# Patient Record
Sex: Female | Born: 1980 | Race: Black or African American | Hispanic: No | Marital: Single | Smoking: Never smoker
Health system: Southern US, Community
[De-identification: ages and names within clinical notes are randomized; demographics above are authoritative.]

## PROBLEM LIST (undated history)

## (undated) DIAGNOSIS — R519 Headache, unspecified: Secondary | ICD-10-CM

## (undated) DIAGNOSIS — I10 Essential (primary) hypertension: Secondary | ICD-10-CM

## (undated) DIAGNOSIS — D259 Leiomyoma of uterus, unspecified: Secondary | ICD-10-CM

## (undated) DIAGNOSIS — F419 Anxiety disorder, unspecified: Secondary | ICD-10-CM

## (undated) DIAGNOSIS — Z302 Encounter for sterilization: Secondary | ICD-10-CM

## (undated) DIAGNOSIS — F32A Depression, unspecified: Secondary | ICD-10-CM

## (undated) DIAGNOSIS — E559 Vitamin D deficiency, unspecified: Secondary | ICD-10-CM

## (undated) DIAGNOSIS — O09299 Supervision of pregnancy with other poor reproductive or obstetric history, unspecified trimester: Secondary | ICD-10-CM

## (undated) DIAGNOSIS — R51 Headache: Secondary | ICD-10-CM

## (undated) DIAGNOSIS — Z3490 Encounter for supervision of normal pregnancy, unspecified, unspecified trimester: Secondary | ICD-10-CM

## (undated) DIAGNOSIS — D649 Anemia, unspecified: Secondary | ICD-10-CM

## (undated) DIAGNOSIS — F329 Major depressive disorder, single episode, unspecified: Secondary | ICD-10-CM

## (undated) DIAGNOSIS — D219 Benign neoplasm of connective and other soft tissue, unspecified: Secondary | ICD-10-CM

## (undated) HISTORY — DX: Morbid (severe) obesity due to excess calories: E66.01

## (undated) HISTORY — PX: ABDOMINAL HYSTERECTOMY: SHX81

## (undated) HISTORY — DX: Anxiety disorder, unspecified: F41.9

## (undated) HISTORY — PX: CHOLECYSTECTOMY: SHX55

## (undated) HISTORY — DX: Benign neoplasm of connective and other soft tissue, unspecified: D21.9

## (undated) HISTORY — DX: Depression, unspecified: F32.A

## (undated) HISTORY — DX: Major depressive disorder, single episode, unspecified: F32.9

## (undated) HISTORY — DX: Leiomyoma of uterus, unspecified: D25.9

## (undated) HISTORY — DX: Supervision of pregnancy with other poor reproductive or obstetric history, unspecified trimester: O09.299

## (undated) HISTORY — PX: TONSILLECTOMY: SUR1361

## (undated) HISTORY — DX: Encounter for sterilization: Z30.2

## (undated) HISTORY — PX: ADENOIDECTOMY: SUR15

## (undated) HISTORY — PX: FOOT SURGERY: SHX648

---

## 2000-05-01 ENCOUNTER — Emergency Department (HOSPITAL_COMMUNITY): Admission: EM | Admit: 2000-05-01 | Discharge: 2000-05-01 | Payer: Self-pay | Admitting: Emergency Medicine

## 2001-11-09 ENCOUNTER — Encounter: Payer: Self-pay | Admitting: Family Medicine

## 2001-11-09 ENCOUNTER — Ambulatory Visit (HOSPITAL_COMMUNITY): Admission: RE | Admit: 2001-11-09 | Discharge: 2001-11-09 | Payer: Self-pay | Admitting: Family Medicine

## 2001-11-11 ENCOUNTER — Ambulatory Visit (HOSPITAL_COMMUNITY): Admission: RE | Admit: 2001-11-11 | Discharge: 2001-11-11 | Payer: Self-pay | Admitting: Family Medicine

## 2001-11-11 ENCOUNTER — Encounter: Payer: Self-pay | Admitting: Family Medicine

## 2002-07-07 ENCOUNTER — Emergency Department (HOSPITAL_COMMUNITY): Admission: EM | Admit: 2002-07-07 | Discharge: 2002-07-07 | Payer: Self-pay | Admitting: Emergency Medicine

## 2002-07-07 ENCOUNTER — Encounter: Payer: Self-pay | Admitting: Emergency Medicine

## 2004-02-28 ENCOUNTER — Emergency Department: Payer: Self-pay | Admitting: Emergency Medicine

## 2004-05-22 ENCOUNTER — Ambulatory Visit: Payer: Self-pay | Admitting: Family Medicine

## 2004-08-11 ENCOUNTER — Ambulatory Visit: Payer: Self-pay | Admitting: Family Medicine

## 2007-03-10 ENCOUNTER — Emergency Department: Payer: Self-pay | Admitting: Emergency Medicine

## 2007-03-12 ENCOUNTER — Ambulatory Visit: Payer: Self-pay | Admitting: Emergency Medicine

## 2007-05-30 DIAGNOSIS — N949 Unspecified condition associated with female genital organs and menstrual cycle: Secondary | ICD-10-CM

## 2007-05-30 DIAGNOSIS — I10 Essential (primary) hypertension: Secondary | ICD-10-CM

## 2007-05-30 DIAGNOSIS — E669 Obesity, unspecified: Secondary | ICD-10-CM | POA: Insufficient documentation

## 2007-06-14 ENCOUNTER — Emergency Department: Payer: Self-pay | Admitting: Emergency Medicine

## 2007-07-19 ENCOUNTER — Observation Stay: Payer: Self-pay

## 2007-09-27 ENCOUNTER — Ambulatory Visit: Payer: Self-pay

## 2007-10-04 ENCOUNTER — Other Ambulatory Visit: Payer: Self-pay

## 2007-10-04 ENCOUNTER — Ambulatory Visit: Payer: Self-pay | Admitting: Cardiovascular Disease

## 2007-10-04 ENCOUNTER — Ambulatory Visit: Payer: Self-pay

## 2007-11-08 ENCOUNTER — Inpatient Hospital Stay: Payer: Self-pay

## 2008-12-20 ENCOUNTER — Encounter: Payer: Self-pay | Admitting: Family Medicine

## 2010-01-08 ENCOUNTER — Ambulatory Visit (HOSPITAL_COMMUNITY)
Admission: RE | Admit: 2010-01-08 | Discharge: 2010-01-08 | Payer: Self-pay | Source: Home / Self Care | Attending: Unknown Physician Specialty | Admitting: Unknown Physician Specialty

## 2010-04-28 ENCOUNTER — Emergency Department (HOSPITAL_COMMUNITY)
Admission: EM | Admit: 2010-04-28 | Discharge: 2010-04-28 | Disposition: A | Discharge status: Home / Self Care | Payer: Medicaid Other | Attending: Emergency Medicine | Admitting: Emergency Medicine

## 2010-04-28 DIAGNOSIS — H9209 Otalgia, unspecified ear: Secondary | ICD-10-CM | POA: Insufficient documentation

## 2010-04-28 DIAGNOSIS — J019 Acute sinusitis, unspecified: Secondary | ICD-10-CM | POA: Insufficient documentation

## 2010-04-28 DIAGNOSIS — J029 Acute pharyngitis, unspecified: Secondary | ICD-10-CM | POA: Insufficient documentation

## 2010-05-02 ENCOUNTER — Encounter: Payer: Self-pay | Admitting: *Deleted

## 2010-07-06 ENCOUNTER — Emergency Department (HOSPITAL_COMMUNITY)
Admission: EM | Admit: 2010-07-06 | Discharge: 2010-07-06 | Disposition: A | Discharge status: Home / Self Care | Payer: Medicaid Other | Attending: Emergency Medicine | Admitting: Emergency Medicine

## 2010-07-06 ENCOUNTER — Emergency Department (HOSPITAL_COMMUNITY): Payer: Medicaid Other

## 2010-07-06 DIAGNOSIS — S9030XA Contusion of unspecified foot, initial encounter: Secondary | ICD-10-CM | POA: Insufficient documentation

## 2010-07-06 DIAGNOSIS — X58XXXA Exposure to other specified factors, initial encounter: Secondary | ICD-10-CM | POA: Insufficient documentation

## 2010-07-22 ENCOUNTER — Emergency Department: Payer: Self-pay | Admitting: Internal Medicine

## 2010-09-21 ENCOUNTER — Emergency Department: Payer: Self-pay | Admitting: Emergency Medicine

## 2010-09-25 ENCOUNTER — Ambulatory Visit: Payer: Self-pay | Admitting: Emergency Medicine

## 2011-03-13 ENCOUNTER — Emergency Department: Payer: Self-pay | Admitting: Emergency Medicine

## 2012-01-18 ENCOUNTER — Ambulatory Visit (HOSPITAL_COMMUNITY)
Admission: RE | Admit: 2012-01-18 | Discharge: 2012-01-18 | Disposition: A | Discharge status: Home / Self Care | Payer: Medicaid Other | Source: Ambulatory Visit | Attending: Internal Medicine | Admitting: Internal Medicine

## 2012-01-18 ENCOUNTER — Other Ambulatory Visit: Payer: Self-pay

## 2012-01-18 ENCOUNTER — Other Ambulatory Visit (HOSPITAL_COMMUNITY): Payer: Self-pay | Admitting: Internal Medicine

## 2012-01-18 DIAGNOSIS — M25561 Pain in right knee: Secondary | ICD-10-CM

## 2012-01-18 DIAGNOSIS — M25569 Pain in unspecified knee: Secondary | ICD-10-CM | POA: Insufficient documentation

## 2012-01-18 DIAGNOSIS — I10 Essential (primary) hypertension: Secondary | ICD-10-CM | POA: Insufficient documentation

## 2012-01-29 ENCOUNTER — Encounter (HOSPITAL_COMMUNITY): Payer: Self-pay | Admitting: Emergency Medicine

## 2012-01-29 ENCOUNTER — Emergency Department (HOSPITAL_COMMUNITY)
Admission: EM | Admit: 2012-01-29 | Discharge: 2012-01-29 | Disposition: A | Discharge status: Home / Self Care | Payer: Medicaid Other | Attending: Emergency Medicine | Admitting: Emergency Medicine

## 2012-01-29 DIAGNOSIS — Z862 Personal history of diseases of the blood and blood-forming organs and certain disorders involving the immune mechanism: Secondary | ICD-10-CM | POA: Insufficient documentation

## 2012-01-29 DIAGNOSIS — J029 Acute pharyngitis, unspecified: Secondary | ICD-10-CM | POA: Insufficient documentation

## 2012-01-29 DIAGNOSIS — R0982 Postnasal drip: Secondary | ICD-10-CM | POA: Insufficient documentation

## 2012-01-29 DIAGNOSIS — I10 Essential (primary) hypertension: Secondary | ICD-10-CM | POA: Insufficient documentation

## 2012-01-29 DIAGNOSIS — J3489 Other specified disorders of nose and nasal sinuses: Secondary | ICD-10-CM | POA: Insufficient documentation

## 2012-01-29 DIAGNOSIS — J069 Acute upper respiratory infection, unspecified: Secondary | ICD-10-CM | POA: Insufficient documentation

## 2012-01-29 DIAGNOSIS — J329 Chronic sinusitis, unspecified: Secondary | ICD-10-CM | POA: Insufficient documentation

## 2012-01-29 DIAGNOSIS — Z8639 Personal history of other endocrine, nutritional and metabolic disease: Secondary | ICD-10-CM | POA: Insufficient documentation

## 2012-01-29 HISTORY — DX: Anemia, unspecified: D64.9

## 2012-01-29 HISTORY — DX: Essential (primary) hypertension: I10

## 2012-01-29 HISTORY — DX: Vitamin D deficiency, unspecified: E55.9

## 2012-01-29 MED ORDER — AMOXICILLIN 500 MG PO CAPS: 500.0000 mg | ORAL_CAPSULE | Freq: Three times a day (TID) | ORAL | Status: DC

## 2012-01-29 MED ORDER — BENZONATATE 100 MG PO CAPS: 100.0000 mg | ORAL_CAPSULE | Freq: Three times a day (TID) | ORAL | Status: DC

## 2012-01-29 MED ORDER — FLUTICASONE PROPIONATE 50 MCG/ACT NA SUSP: 2.0000 | Freq: Every day | NASAL | Status: DC

## 2012-01-29 NOTE — ED Provider Notes (Signed)
History     CSN: [id number]  Arrival date & time 01/29/12  1200   First MD Initiated Contact with Patient 01/29/12 1216      Chief Complaint  Patient presents with  . Cough  . Nasal Congestion    (Consider location/radiation/quality/duration/timing/severity/associated sxs/prior treatment) HPI Comments: 32 year old female presents to the emergency department complaining of chest and nasal congestion for the past 5 days. Admits to associated cough with mucus. Also complaining of postnasal drip. Denies fever, body aches or pains, abdominal pain, nausea, vomiting or diarrhea. She has tried multiple over-the-counter medications without relief. Her children are sick and on antibiotics.  The history is provided by the patient.    Past Medical History  Diagnosis Date  . Vitamin D insufficiency   . Hypertension   . Anemia     Past Surgical History  Procedure Date  . Cholecystectomy   . Foot surgery     History reviewed. No pertinent family history.  History  Substance Use Topics  . Smoking status: Never Smoker   . Smokeless tobacco: Not on file  . Alcohol Use: No    OB History    Grav Para Term Preterm Abortions TAB SAB Ect Mult Living                  Review of Systems  Constitutional: Negative for fever and chills.  HENT: Positive for congestion, sore throat, rhinorrhea and sinus pressure. Negative for trouble swallowing, neck pain and neck stiffness.   Eyes: Negative.   Respiratory: Positive for cough. Negative for shortness of breath.   Cardiovascular: Negative for chest pain.  Gastrointestinal: Negative for nausea, vomiting, abdominal pain and diarrhea.  Genitourinary: Negative.   Musculoskeletal: Negative for myalgias and arthralgias.  Skin: Negative for rash.  Neurological: Negative for dizziness, light-headedness and headaches.    Allergies  Review of patient's allergies indicates no known allergies.  Home Medications   Current Outpatient Rx  Name   Route  Sig  Dispense  Refill  . AMOXICILLIN 500 MG PO CAPS   Oral   Take 1 capsule (500 mg total) by mouth 3 (three) times daily.   21 capsule   0   . FLUTICASONE PROPIONATE 50 MCG/ACT NA SUSP   Nasal   Place 2 sprays into the nose daily.   16 g   0     BP 137/81  Pulse 94  Temp 98.7 F (37.1 C) (Oral)  Resp 20  SpO2 99%  LMP 01/23/2012  Physical Exam  Nursing note and vitals reviewed. Constitutional: She is oriented to person, place, and time. She appears well-developed and well-nourished. No distress.  HENT:  Head: Normocephalic and atraumatic.  Nose: Mucosal edema and rhinorrhea present.  Mouth/Throat: Uvula is midline and mucous membranes are normal. Posterior oropharyngeal edema and posterior oropharyngeal erythema present. No oropharyngeal exudate.       Post nasal drip present.  Eyes: Conjunctivae normal and EOM are normal.  Neck: Normal range of motion. Neck supple.  Cardiovascular: Normal rate, regular rhythm and normal heart sounds.   Pulmonary/Chest: Effort normal and breath sounds normal. She has no wheezes. She has no rhonchi.  Musculoskeletal: Normal range of motion. She exhibits no edema.  Lymphadenopathy:    She has cervical adenopathy.  Neurological: She is alert and oriented to person, place, and time.  Skin: Skin is warm and dry.  Psychiatric: She has a normal mood and affect. Her behavior is normal.    ED Course  Procedures (including  critical care time)  Labs Reviewed - No data to display No results found.   1. URI (upper respiratory infection)   2. Sinusitis       MDM  33 y/o female with sinusitis and URI. Children are on abx. No improvement despite conservative measures the pat 5 days. Rx amoxicillin, tessalon and flonase. Return precautions discussed. Patient states understanding of plan and is agreeable.         Trevor Mace, PA-C 01/29/12 1303  Trevor Mace, PA-C 01/29/12 1304

## 2012-01-29 NOTE — ED Provider Notes (Signed)
Medical screening examination/treatment/procedure(s) were performed by non-physician practitioner and as supervising physician I was immediately available for consultation/collaboration. Devoria Albe, MD, Armando Gang   Ward Givens, MD 01/29/12 [phone]

## 2012-01-29 NOTE — ED Notes (Signed)
Cough and congestion for 5 days. Denies fever.

## 2012-01-29 NOTE — ED Notes (Signed)
Left in c/o family for transport home; instructions, prescriptions and f/u information given/reviewed - verbalizes understanding.  

## 2012-07-01 ENCOUNTER — Encounter (HOSPITAL_COMMUNITY): Payer: Self-pay | Admitting: *Deleted

## 2012-07-01 ENCOUNTER — Emergency Department (HOSPITAL_COMMUNITY)
Admission: EM | Admit: 2012-07-01 | Discharge: 2012-07-01 | Disposition: A | Discharge status: Home / Self Care | Payer: Self-pay | Attending: Emergency Medicine | Admitting: Emergency Medicine

## 2012-07-01 ENCOUNTER — Emergency Department (HOSPITAL_COMMUNITY): Payer: Self-pay

## 2012-07-01 DIAGNOSIS — Z862 Personal history of diseases of the blood and blood-forming organs and certain disorders involving the immune mechanism: Secondary | ICD-10-CM | POA: Insufficient documentation

## 2012-07-01 DIAGNOSIS — Z79899 Other long term (current) drug therapy: Secondary | ICD-10-CM | POA: Insufficient documentation

## 2012-07-01 DIAGNOSIS — M25551 Pain in right hip: Secondary | ICD-10-CM

## 2012-07-01 DIAGNOSIS — M25569 Pain in unspecified knee: Secondary | ICD-10-CM | POA: Insufficient documentation

## 2012-07-01 DIAGNOSIS — E559 Vitamin D deficiency, unspecified: Secondary | ICD-10-CM | POA: Insufficient documentation

## 2012-07-01 DIAGNOSIS — E669 Obesity, unspecified: Secondary | ICD-10-CM | POA: Insufficient documentation

## 2012-07-01 DIAGNOSIS — I10 Essential (primary) hypertension: Secondary | ICD-10-CM | POA: Insufficient documentation

## 2012-07-01 DIAGNOSIS — M25559 Pain in unspecified hip: Secondary | ICD-10-CM | POA: Insufficient documentation

## 2012-07-01 DIAGNOSIS — IMO0002 Reserved for concepts with insufficient information to code with codable children: Secondary | ICD-10-CM | POA: Insufficient documentation

## 2012-07-01 DIAGNOSIS — G8929 Other chronic pain: Secondary | ICD-10-CM | POA: Insufficient documentation

## 2012-07-01 MED ORDER — HYDROCODONE-ACETAMINOPHEN 5-325 MG PO TABS: ORAL_TABLET | ORAL | Status: DC

## 2012-07-01 MED ORDER — PREDNISONE 10 MG PO TABS: ORAL_TABLET | ORAL | Status: DC

## 2012-07-01 NOTE — ED Notes (Signed)
Right hip pain radiating to knee. Intermittent pain x 2 mo. Seen by PMD for same. Had xray which was negative. Pt states pain returned a week ago and has been constant.

## 2012-07-01 NOTE — ED Notes (Signed)
Pain rt hip for over 1 month, neg x-rays for same and says Dr Felecia Shelling told her it was arthritis.  Says it is worse at times.

## 2012-07-05 NOTE — ED Provider Notes (Signed)
History    CSN: [id number] Arrival date & time 07/01/12  1844  First MD Initiated Contact with Patient 07/01/12 1920     Chief Complaint  Patient presents with  . Leg Pain   (Consider location/radiation/quality/duration/timing/severity/associated sxs/prior Treatment) Patient is a 32 y.o. female presenting with leg pain. The history is provided by the patient.  Leg Pain Location:  Hip and knee Time since incident:  2 months Injury: no   Hip location:  R hip Knee location:  R knee Pain details:    Quality:  Aching   Radiates to:  R leg   Severity:  Moderate   Onset quality:  Gradual   Timing:  Constant   Progression:  Worsening Chronicity:  Chronic Foreign body present:  No foreign bodies Prior injury to area:  Unable to specify Relieved by:  Nothing Worsened by:  Activity and bearing weight Ineffective treatments:  NSAIDs Associated symptoms: back pain   Associated symptoms: no decreased ROM, no fatigue, no fever, no muscle weakness, no neck pain, no numbness, no stiffness, no swelling and no tingling   Risk factors: obesity    Past Medical History  Diagnosis Date  . Vitamin D insufficiency   . Hypertension   . Anemia    Past Surgical History  Procedure Laterality Date  . Cholecystectomy    . Foot surgery     No family history on file. History  Substance Use Topics  . Smoking status: Never Smoker   . Smokeless tobacco: Not on file  . Alcohol Use: No   OB History   Grav Para Term Preterm Abortions TAB SAB Ect Mult Living                 Review of Systems  Constitutional: Negative for fever, chills and fatigue.  HENT: Negative for neck pain.   Gastrointestinal: Negative for nausea, vomiting and abdominal pain.  Genitourinary: Negative for dysuria and difficulty urinating.  Musculoskeletal: Positive for back pain, joint swelling and arthralgias. Negative for stiffness.  Skin: Negative for color change and wound.  Neurological: Negative for dizziness,  weakness and numbness.  All other systems reviewed and are negative.    Allergies  Aspirin  Home Medications   Current Outpatient Rx  Name  Route  Sig  Dispense  Refill  . chlorthalidone (HYGROTON) 25 MG tablet   Oral   Take 12.5 mg by mouth daily.         . cholecalciferol (VITAMIN D) 1000 UNITS tablet   Oral   Take 1,000 Units by mouth once a week.         . ferrous sulfate 325 (65 FE) MG tablet   Oral   Take 325 mg by mouth daily.         . fluticasone (FLONASE) 50 MCG/ACT nasal spray   Nasal   Place 2 sprays into the nose daily.   16 g   0   . HYDROcodone-acetaminophen (NORCO/VICODIN) 5-325 MG per tablet      Take one-two tabs po q 4-6 hrs prn pain   20 tablet   0   . predniSONE (DELTASONE) 10 MG tablet      Take 6 tablets day one, 5 tablets day two, 4 tablets day three, 3 tablets day four, 2 tablets day five, then 1 tablet day six   21 tablet   0    BP 131/92  Pulse 90  Temp(Src) 98.9 F (37.2 C) (Oral)  Resp 20  Ht 5\' 5"  (  1.651 m)  Wt 185 lb (83.915 kg)  BMI 30.79 kg/m2  SpO2 100%  LMP 06/12/2012 Physical Exam  Nursing note and vitals reviewed. Constitutional: She is oriented to person, place, and time. She appears well-developed and well-nourished. No distress.  Cardiovascular: Normal rate, regular rhythm, normal heart sounds and intact distal pulses.   Pulmonary/Chest: Effort normal and breath sounds normal. No respiratory distress.  Abdominal: Soft. She exhibits no distension. There is no tenderness.  Musculoskeletal: She exhibits tenderness. She exhibits no edema.  ttp of the right anterior knee and lateral right hip.  No erythema, bruising or deformity.  Pain reproduced with internal rotation of the hip.  Distal sensation intact and DP pulse brisk.  No calf pain or edema. No skin lesion  Neurological: She is alert and oriented to person, place, and time. She exhibits normal muscle tone. Coordination normal.  Skin: Skin is warm and dry. No  erythema.    ED Course  Procedures (including critical care time) Labs Reviewed - No data to display No results found.  Dg Hip Complete Right  07/01/2012   *RADIOLOGY REPORT*  Clinical Data: Right hip pain.  RIGHT HIP - COMPLETE 2+ VIEW  Comparison: None  Findings: Both hips are normally located.  No acute bony findings or degenerative changes.  No plain film evidence of avascular necrosis.  The pubic symphysis and SI joints are intact.  No pelvic fractures.  IMPRESSION: No acute bony findings or significant degenerative changes.   Original Report Authenticated By: Rudie Meyer, M.D.     1. Arthralgia of hip or thigh, right   2. Knee pain, chronic, right     MDM    Patient has ttp of the right  lumbar paraspinal muscles.  No focal neuro deficits on exam.  Ambulates with a steady gait.   Pt has previous plain film of the knee ordered by her PMD that was neg for acute findings.  Referral given for orthopedics  Teale Goodgame L. Trysten Berti, PA-C 07/05/12 1709

## 2012-07-06 NOTE — ED Provider Notes (Signed)
Medical screening examination/treatment/procedure(s) were performed by non-physician practitioner and as supervising physician I was immediately available for consultation/collaboration.  Elvy Mclarty, MD 07/06/12 0915 

## 2012-08-11 ENCOUNTER — Encounter: Payer: Self-pay | Admitting: *Deleted

## 2012-08-19 ENCOUNTER — Encounter: Payer: Self-pay | Admitting: Obstetrics & Gynecology

## 2012-08-19 ENCOUNTER — Ambulatory Visit (INDEPENDENT_AMBULATORY_CARE_PROVIDER_SITE_OTHER): Payer: BC Managed Care – PPO | Admitting: Obstetrics & Gynecology

## 2012-08-19 VITALS — BP 150/100 | Ht 65.2 in | Wt 274.0 lb

## 2012-08-19 DIAGNOSIS — Z3202 Encounter for pregnancy test, result negative: Secondary | ICD-10-CM

## 2012-08-19 DIAGNOSIS — N912 Amenorrhea, unspecified: Secondary | ICD-10-CM

## 2012-08-19 NOTE — Progress Notes (Signed)
Patient ID: Donna Vaughan, female   DOB: 08/23/1980, 32 y.o.   MRN: [id number] Pt here for urine pregnancy test only, was negative. Pt requested QHCG.

## 2012-09-27 ENCOUNTER — Ambulatory Visit: Payer: BC Managed Care – PPO | Admitting: Adult Health

## 2013-02-01 ENCOUNTER — Emergency Department (HOSPITAL_COMMUNITY)
Admission: EM | Admit: 2013-02-01 | Discharge: 2013-02-01 | Disposition: A | Discharge status: Home / Self Care | Payer: Medicaid Other | Attending: Emergency Medicine | Admitting: Emergency Medicine

## 2013-02-01 ENCOUNTER — Encounter: Payer: Self-pay | Admitting: Obstetrics and Gynecology

## 2013-02-01 ENCOUNTER — Ambulatory Visit (INDEPENDENT_AMBULATORY_CARE_PROVIDER_SITE_OTHER): Payer: Medicaid Other | Admitting: Obstetrics and Gynecology

## 2013-02-01 ENCOUNTER — Encounter (HOSPITAL_COMMUNITY): Payer: Self-pay | Admitting: Emergency Medicine

## 2013-02-01 ENCOUNTER — Emergency Department (HOSPITAL_COMMUNITY): Payer: Medicaid Other

## 2013-02-01 VITALS — BP 140/90 | Ht 65.0 in | Wt 270.0 lb

## 2013-02-01 DIAGNOSIS — IMO0002 Reserved for concepts with insufficient information to code with codable children: Secondary | ICD-10-CM | POA: Insufficient documentation

## 2013-02-01 DIAGNOSIS — I10 Essential (primary) hypertension: Secondary | ICD-10-CM | POA: Insufficient documentation

## 2013-02-01 DIAGNOSIS — O200 Threatened abortion: Secondary | ICD-10-CM

## 2013-02-01 DIAGNOSIS — O169 Unspecified maternal hypertension, unspecified trimester: Secondary | ICD-10-CM | POA: Insufficient documentation

## 2013-02-01 DIAGNOSIS — Z79899 Other long term (current) drug therapy: Secondary | ICD-10-CM | POA: Insufficient documentation

## 2013-02-01 DIAGNOSIS — O209 Hemorrhage in early pregnancy, unspecified: Secondary | ICD-10-CM | POA: Insufficient documentation

## 2013-02-01 DIAGNOSIS — E559 Vitamin D deficiency, unspecified: Secondary | ICD-10-CM | POA: Insufficient documentation

## 2013-02-01 DIAGNOSIS — D649 Anemia, unspecified: Secondary | ICD-10-CM | POA: Insufficient documentation

## 2013-02-01 LAB — ABO/RH: ABO/RH(D): O POS

## 2013-02-01 LAB — HCG, QUANTITATIVE, PREGNANCY: HCG, BETA CHAIN, QUANT, S: 5818 m[IU]/mL — AB (ref <5)

## 2013-02-01 NOTE — Discharge Instructions (Signed)
As discussed, your evaluation here suggests that you are having a miscarriage, though the results are not conclusive.  [place], it is important that you followup with your obstetrician, to ensure appropriate ongoing evaluation and management.  Return here for any concerning changes in your condition  Your HCG was 5818 (on 02/01/13)  Your Ultrasound results: IMPRESSION: No definite intrauterine gestational sac identified. Multiple uterine fibroids, with probable internal necrosis/ cystic change within a left posterior intramural fibroid, not in continuity with the endometrium, apparently mimicking a gestational sac at real time imaging. Theoretically, this could represent an abnormal gestational sac, however fibroid cystic degeneration is favored as the more likely diagnosis. Alternative diagnoses could include intrauterine gestation too early to be visualized, missed abortion, or ectopic pregnancy. Clinical followup is suggested including quantitative beta HCG, and followup ultrasound in 10-14 days is recommended unless clinically indicated earlier.

## 2013-02-01 NOTE — ED Provider Notes (Signed)
10:28 AM Patient awake, alert in NAD. We discussed all results, specifically ultrasound and hCG. She has an obstetrician with eventual followup today via telephone to ensure followup, repeat evaluation.  Carmin Muskrat, MD 02/01/13 1028

## 2013-02-01 NOTE — Progress Notes (Signed)
   [facility] Visit  Patient name: Donna Vaughan MRN [id number]  Date of birth: 10/27/1980  CC & HPI:  Donna Vaughan is a 33 y.o. female presenting today for vaginal bleeding that started yesterday. Was spotting yesterday. Now having intermittent vaginal bleeding. Seen at [facility] this morning for same and told that she was having a miscarriage. US done. Was told that there was a ?sac obstructed by fibroids. Pt reports that she was trying for pregnancy but was not aware that she was at the time of the ED visit.   ROS:  Vaginal bleeding Denies any other symptoms  Pertinent History Reviewed:  Medical & Surgical Hx:  Reviewed: Significant for HTN, fibroids (1 nickel sized, 2 dime sized) Medications: Reviewed & Updated - see associated section Social History: Reviewed -  reports that she has never smoked. She does not have any smokeless tobacco history on file.  Objective Findings:  Vitals: BP 140/90  Ht 5\' 5"  (1.651 m)  Wt 270 lb (122.471 kg)  BMI 44.93 kg/m2  LMP 11/26/2012 *Pelvic examined performed with pt's permission. Chaperone present. Exam performed with no discomfort or complications. Physical Examination: General appearance - alert, well appearing, and in no distress and oriented to person, place, and time Mental status - alert, oriented to person, place, and time, normal mood, behavior, speech, dress, motor activity, and thought processes Pelvic -  VULVA: normal appearing vulva with no masses, tenderness or lesions, VAGINA: normal appearing vagina with normal color and discharge, no lesions, vaginal discharge - copious and bloody, CERVIX: normal appearing cervix without discharge or lesions,clots at ext os, cervical discharge present - bloody Uterine size undetermined . Limited by body habitus    Assessment & Plan:  [place] reviewed. Gestation sac in the uterus. -FHM in 7  Mm suspected fetal pole Suspect threatened miscarriage. Will repeat quantitative hCG in 2  days Pt agrees to plan      This chart was scribed by Jenne Campus, Medical Scribe, for Dr. Mallory Shirk on 02/01/13 at 3:43 PM. This chart was reviewed by Dr. Mallory Shirk and is accurate.

## 2013-02-01 NOTE — ED Provider Notes (Signed)
CSN: [id number]     Arrival date & time 02/01/13  0506 History   First MD Initiated Contact with Patient 02/01/13 [phone]     Chief Complaint  Patient presents with  . Vaginal Bleeding   (Consider location/radiation/quality/duration/timing/severity/associated sxs/prior Treatment) HPI This is a 33 year old female gravida 3 para 2 who is approximately [redacted] weeks pregnant. She began spotting yesterday afternoon. About an hour prior to arrival she experienced an increase in vaginal bleeding. She states the bleeding was about that of a period and was accompanied by passage of clots. Her bleeding has subsequently slowed down. She has had no pain or cramping with this. She denies any nausea, vomiting, fever, chills or other acute complaints. She has had prenatal care and saw her OB/GYN yesterday.  Past Medical History  Diagnosis Date  . Vitamin D insufficiency   . Hypertension   . Anemia    Past Surgical History  Procedure Laterality Date  . Cholecystectomy    . Foot surgery     History reviewed. No pertinent family history. History  Substance Use Topics  . Smoking status: Never Smoker   . Smokeless tobacco: Not on file  . Alcohol Use: No   OB History   Grav Para Term Preterm Abortions TAB SAB Ect Mult Living   1              Review of Systems  All other systems reviewed and are negative.    Allergies  Aspirin  Home Medications   Current Outpatient Rx  Name  Route  Sig  Dispense  Refill  . methyldopa (ALDOMET) 250 MG tablet   Oral   Take 250 mg by mouth 2 (two) times daily.         . Prenatal Vit-Fe Fumarate-FA (PRENATAL MULTIVITAMIN) TABS tablet   Oral   Take 1 tablet by mouth daily at 12 noon.         . chlorthalidone (HYGROTON) 25 MG tablet   Oral   Take 12.5 mg by mouth daily.         . cholecalciferol (VITAMIN D) 1000 UNITS tablet   Oral   Take 1,000 Units by mouth once a week.         . ferrous sulfate 325 (65 FE) MG tablet   Oral   Take 325 mg by  mouth daily.         . fluticasone (FLONASE) 50 MCG/ACT nasal spray   Nasal   Place 2 sprays into the nose daily.   16 g   0   . HYDROcodone-acetaminophen (NORCO/VICODIN) 5-325 MG per tablet      Take one-two tabs po q 4-6 hrs prn pain   20 tablet   0   . predniSONE (DELTASONE) 10 MG tablet      Take 6 tablets day one, 5 tablets day two, 4 tablets day three, 3 tablets day four, 2 tablets day five, then 1 tablet day six   21 tablet   0    BP 146/92  Pulse 100  Temp(Src) 98.1 F (36.7 C) (Oral)  Resp 20  SpO2 100%  LMP 11/15/2012  Physical Exam General: Well-developed, well-nourished female in no acute distress; appearance consistent with age of record HENT: normocephalic; atraumatic Eyes: pupils equal, round and reactive to light; extraocular muscles intact Neck: supple Heart: regular rate and rhythm Lungs: clear to auscultation bilaterally Abdomen: soft; nondistended; nontender; no masses or hepatosplenomegaly; bowel sounds present GU: Normal external genitalia; blood and clots in the  vagina; cervical os closed; no cervical motion tenderness Extremities: No deformity; full range of motion; pulses normal Neurologic: Awake, alert and oriented; motor function intact in all extremities and symmetric; no facial droop Skin: Warm and dry Psychiatric: Normal mood and affect    ED Course  Procedures (including critical care time)    MDM   Nursing notes and vitals signs, including pulse oximetry, reviewed.  Summary of this visit's results, reviewed by myself:  Labs:  Results for orders placed during the hospital encounter of 02/01/13 (from the past 24 hour(s))  HCG, QUANTITATIVE, PREGNANCY     Status: Abnormal   Collection Time    02/01/13  5:31 AM      Result Value Range   hCG, Beta Chain, Quant, S 5818 (*) <5 mIU/mL  ABO/RH     Status: None   Collection Time    02/01/13  5:31 AM      Result Value Range   ABO/RH(D) O POS     7:56 AM Transvaginal  ultrasound pending. Dr. Vanita Panda will review results and make disposition.    Wynetta Fines, MD 02/01/13 [phone]

## 2013-02-01 NOTE — Patient Instructions (Signed)
Threatened Miscarriage   A threatened miscarriage is a pregnancy that may end. It may be marked by bleeding during the first 20 weeks of pregnancy. Often, the pregnancy can continue without any more problems. You may be asked to stop:  · Having sex (intercourse).  · Having orgasms.  · Using tampons.  · Exercising.  · Doing heavy physical activity and work.  HOME CARE   · Your doctor may tell you to take bed rest and to stop activities and work.  · Write down the number of pads you use each day. Write down how often you change pads. Write down how soaked they are.  · Follow your doctor's advice for follow-up visits and tests.  · If your blood type is Rh-negative and the father's blood is Rh-positive (or is not known), you may get a shot to protect the baby.  · If you have a miscarriage, save all the tissue you pass in a container. Take the container to your doctor.  GET HELP RIGHT AWAY IF:   · You have bad cramps or pain in your belly (abdomen), lower belly, or back.  · You have a fever or chills.  · Your bleeding gets worse or you pass large clots of blood or tissue. Save this tissue to show your doctor.  · You feel lightheaded, weak, dizzy, or pass out (faint).  · You have a gush of fluid from your vagina.  MAKE SURE YOU:   · Understand these instructions.  · Will watch your condition.  · Will get help right away if you are not doing well or get worse.  Document Released: 12/12/2007 Document Revised: 03/23/2011 Document Reviewed: 01/14/2009  [employer]® Patient Information ©2014 [employer].

## 2013-02-01 NOTE — ED Notes (Signed)
Patient reports is [redacted] weeks pregnant and started having light vaginal bleeding on Tuesday. Saw OB doctor yesterday. Reports urinated this morning and large gush of blood came from vagina.

## 2013-02-03 ENCOUNTER — Other Ambulatory Visit: Payer: Medicaid Other

## 2013-02-03 DIAGNOSIS — O200 Threatened abortion: Secondary | ICD-10-CM

## 2013-02-04 LAB — HCG, QUANTITATIVE, PREGNANCY: hCG, Beta Chain, Quant, S: 875.8 m[IU]/mL

## 2013-02-06 ENCOUNTER — Telehealth: Payer: Self-pay | Admitting: *Deleted

## 2013-02-06 ENCOUNTER — Encounter: Payer: Self-pay | Admitting: Obstetrics and Gynecology

## 2013-02-06 ENCOUNTER — Ambulatory Visit (INDEPENDENT_AMBULATORY_CARE_PROVIDER_SITE_OTHER): Payer: Medicaid Other | Admitting: Obstetrics and Gynecology

## 2013-02-06 VITALS — BP 122/86 | Ht 65.0 in | Wt 264.4 lb

## 2013-02-06 DIAGNOSIS — O039 Complete or unspecified spontaneous abortion without complication: Secondary | ICD-10-CM

## 2013-02-06 DIAGNOSIS — Z3201 Encounter for pregnancy test, result positive: Secondary | ICD-10-CM

## 2013-02-06 LAB — POCT URINE PREGNANCY: Preg Test, Ur: POSITIVE

## 2013-02-06 NOTE — Telephone Encounter (Signed)
Pt c/o "vaginal bleeding and passing tissue." Pt informed QHCG 875.8, is dropping. Pt MAB per Derrek Monaco, NP. Appt made today for pt to f/u with Dr. Glo Herring.

## 2013-02-06 NOTE — Progress Notes (Signed)
Patient ID: Donna Vaughan, female   DOB: 17-Nov-1980, 33 y.o.   MRN: [id number]   [facility] Visit  Patient name: Donna Vaughan MRN [id number]  Date of birth: [birth year]  CC & HPI:  Donna Vaughan is a 33 y.o. female presenting today for followup spont Ab with Blood type O pos., spont passage of quarter. HCG still positive.  ROS:  No significant bleeding in last few days, only lite amount, no cramps Pt's quiestions re future pregnancy answered.  Pertinent History Reviewed:  Medical & Surgical Hx:  Reviewed: Significant for  Medications: Reviewed & Updated - see associated section Social History: Reviewed -  reports that she has never smoked. She does not have any smokeless tobacco history on file.  Objective Findings:  Vitals: BP 122/86  Ht 5\' 5"  (1.651 m)  Wt 264 lb 6.4 oz (119.931 kg)  BMI 44.00 kg/m2  LMP 11/23/2012  Physical Examination: General appearance - alert, well appearing, and in no distress and overweight Abdomen - soft, nontender, nondistended, no masses or organomegaly Pelvic - normal external genitalia, vulva, vagina, cervix, uterus and adnexa, VAGINA: normal appearing vagina with normal color and discharge, no lesions Lite blood in vag vault.   Assessment & Plan:   Spont ABortion, completed.

## 2013-02-06 NOTE — Patient Instructions (Addendum)
Website:  [email or web address] Please wait one month to restart efforts to conceive.  stay on PNVits

## 2013-07-21 ENCOUNTER — Ambulatory Visit: Payer: Self-pay | Admitting: Obstetrics and Gynecology

## 2013-11-06 ENCOUNTER — Emergency Department (HOSPITAL_COMMUNITY)
Admission: EM | Admit: 2013-11-06 | Discharge: 2013-11-06 | Payer: Medicaid Other | Attending: Emergency Medicine | Admitting: Emergency Medicine

## 2013-11-06 ENCOUNTER — Observation Stay: Payer: Self-pay | Admitting: Obstetrics and Gynecology

## 2013-11-06 ENCOUNTER — Encounter (HOSPITAL_COMMUNITY): Payer: Self-pay | Admitting: Emergency Medicine

## 2013-11-06 DIAGNOSIS — Z3A35 35 weeks gestation of pregnancy: Secondary | ICD-10-CM | POA: Diagnosis not present

## 2013-11-06 DIAGNOSIS — Z862 Personal history of diseases of the blood and blood-forming organs and certain disorders involving the immune mechanism: Secondary | ICD-10-CM | POA: Insufficient documentation

## 2013-11-06 DIAGNOSIS — O10013 Pre-existing essential hypertension complicating pregnancy, third trimester: Secondary | ICD-10-CM | POA: Diagnosis not present

## 2013-11-06 DIAGNOSIS — O9989 Other specified diseases and conditions complicating pregnancy, childbirth and the puerperium: Secondary | ICD-10-CM | POA: Diagnosis not present

## 2013-11-06 DIAGNOSIS — Z79899 Other long term (current) drug therapy: Secondary | ICD-10-CM | POA: Insufficient documentation

## 2013-11-06 DIAGNOSIS — R358 Other polyuria: Secondary | ICD-10-CM | POA: Insufficient documentation

## 2013-11-06 DIAGNOSIS — Z8639 Personal history of other endocrine, nutritional and metabolic disease: Secondary | ICD-10-CM | POA: Insufficient documentation

## 2013-11-06 HISTORY — DX: Encounter for supervision of normal pregnancy, unspecified, unspecified trimester: Z34.90

## 2013-11-06 LAB — URINALYSIS, ROUTINE W REFLEX MICROSCOPIC
BILIRUBIN URINE: NEGATIVE
GLUCOSE, UA: NEGATIVE mg/dL
HGB URINE DIPSTICK: NEGATIVE
KETONES UR: NEGATIVE mg/dL
Nitrite: NEGATIVE
PH: 6 (ref 5.0–8.0)
PROTEIN: NEGATIVE mg/dL
Specific Gravity, Urine: 1.015 (ref 1.005–1.030)
Urobilinogen, UA: 0.2 mg/dL (ref 0.0–1.0)

## 2013-11-06 LAB — URINE MICROSCOPIC-ADD ON

## 2013-11-06 LAB — URINALYSIS, COMPLETE
BLOOD: NEGATIVE
Bacteria: NONE SEEN
Bilirubin,UR: NEGATIVE
Glucose,UR: NEGATIVE mg/dL (ref 0–75)
Leukocyte Esterase: NEGATIVE
Nitrite: NEGATIVE
PROTEIN: NEGATIVE
Ph: 6 (ref 4.5–8.0)
RBC, UR: NONE SEEN /HPF (ref 0–5)
SPECIFIC GRAVITY: 1.01 (ref 1.003–1.030)
Squamous Epithelial: 1

## 2013-11-06 MED ORDER — SODIUM CHLORIDE 0.9 % IV BOLUS (SEPSIS)
1000.0000 mL | Freq: Once | INTRAVENOUS | Status: AC
  Administered 2013-11-06: 1000 mL via INTRAVENOUS

## 2013-11-06 MED ORDER — MORPHINE SULFATE 4 MG/ML IJ SOLN
4.0000 mg | Freq: Once | INTRAMUSCULAR | Status: AC
Start: 2013-11-06 — End: 2013-11-06
  Administered 2013-11-06: 4 mg via INTRAVENOUS
  Filled 2013-11-06: qty 1

## 2013-11-06 NOTE — ED Notes (Signed)
[facility] here to transport pt.   

## 2013-11-06 NOTE — ED Notes (Signed)
Mary, rapid response RN notified to monitor patient. Pertinent information provided.

## 2013-11-06 NOTE — Progress Notes (Signed)
Spoke with Bethena Roys from [facility]. States pt is goin g to be transferred to [facility] shortly. FHR is reactive. Category 1 tracing. Pt is ? Contracting every 3-4 min.

## 2013-11-06 NOTE — ED Notes (Signed)
Called [facility] for transport to [facility] ER, Dr. Rubie Maid accepting.    Per Jonelle Sidle, "[facility] is dropping off a Pt here to the floor and will pick up our Pt afterwards to transfer to [facility]".  Bethena Roys informed.Marland Kitchen

## 2013-11-06 NOTE — Progress Notes (Signed)
Dr. Elly Modena notified that pt is at [facility] and will be transferred to [facility]. FHR tracing is reactive. Pt may be contracting every 3-4 min.

## 2013-11-06 NOTE — ED Notes (Signed)
Pt is [redacted] weeks pregnant and cramping,  Increased urination.  No vag d/c  Has felt baby move normallly

## 2013-11-06 NOTE — ED Provider Notes (Signed)
CSN: [id number]     Arrival date & time 11/06/13  1639 History  This chart was scribed for Johnna Acosta, MD by Jeanell Sparrow, ED Scribe. This patient was seen in room APA01/APA01 and the patient's care was started at 4:57 PM.   No chief complaint on file.  The history is provided by the patient. No language interpreter was used.   HPI Comments: Donna Vaughan is a 33 y.o. female who presents to the Emergency Department complaining of intermittent moderate abdominal pain that started about 15 hours ago (2am last night). She reports that she is currently [redacted] weeks pregnant, G4 P2012 with a high risk for high bloodpressure during pregnancy. She describes the pain as a cramping and pulling sensation with episodes lasting 5 minutes. She reports that she took tylenol with codeine with no relief. She reports that she has some polyuria.She states that she has no other associated symptoms. She denies any vaginal discharge, cough, or dysuria.   OB/GYN- Dr. Marcelline Mates at [facility] [phone] Past Medical History  Diagnosis Date  . Vitamin D insufficiency   . Hypertension   . Anemia   . Pregnant    Past Surgical History  Procedure Laterality Date  . Cholecystectomy    . Foot surgery    . Tonsillectomy    . Adenoidectomy     Family History  Problem Relation Age of Onset  . Hypertension Brother   . Asthma Son   . Diabetes Maternal Uncle   . Stroke Maternal Grandfather   . Hypertension Paternal Grandmother   . Heart disease Paternal Grandmother     CHF  . COPD Son     bronchitis   History  Substance Use Topics  . Smoking status: Never Smoker   . Smokeless tobacco: Not on file  . Alcohol Use: No   OB History   Grav Para Term Preterm Abortions TAB SAB Ect Mult Living   3 2 2       2      Obstetric Comments   misc     Review of Systems  Respiratory: Negative for cough.   Gastrointestinal: Positive for abdominal pain.  Endocrine: Positive for polyuria.  Genitourinary:  Negative for dysuria and vaginal discharge.  All other systems reviewed and are negative.   Allergies  Aspirin  Home Medications   Prior to Admission medications   Medication Sig Start Date End Date Taking? Authorizing Provider  methyldopa (ALDOMET) 250 MG tablet Take 250 mg by mouth 2 (two) times daily.    Historical Provider, MD  Prenatal Vit-Fe Fumarate-FA (PRENATAL MULTIVITAMIN) TABS tablet Take 2 tablets by mouth daily at 12 noon.     Historical Provider, MD   BP 130/90  Pulse 100  Temp(Src) 98.7 F (37.1 C) (Oral)  Ht 5\' 5"  (1.651 m)  Wt 254 lb (115.214 kg)  BMI 42.27 kg/m2  SpO2 100%  LMP 03/05/2013 Physical Exam  Nursing note and vitals reviewed. Constitutional: She appears well-developed and well-nourished. No distress.  HENT:  Head: Normocephalic and atraumatic.  Mouth/Throat: Oropharynx is clear and moist. No oropharyngeal exudate.  Eyes: Conjunctivae and EOM are normal. Pupils are equal, round, and reactive to light. Right eye exhibits no discharge. Left eye exhibits no discharge. No scleral icterus.  Neck: Normal range of motion. Neck supple. No JVD present. No thyromegaly present.  Cardiovascular: Normal rate, regular rhythm, normal heart sounds and intact distal pulses.  Exam reveals no gallop and no friction rub.   No  murmur heard. Pulmonary/Chest: Effort normal and breath sounds normal. No respiratory distress. She has no wheezes. She has no rales.  Abdominal: Soft. Bowel sounds are normal. She exhibits no distension and no mass.  Gravid uterus, abd non tender, normal fetal movements and fetal heart rate  Genitourinary:  Chaperone present, cervix is soft, posterior and finger tip.  Station is 0.    Musculoskeletal: Normal range of motion. She exhibits no edema and no tenderness.  Lymphadenopathy:    She has no cervical adenopathy.  Neurological: She is alert. Coordination normal.  Skin: Skin is warm and dry. No rash noted. No erythema.  Psychiatric: She has  a normal mood and affect. Her behavior is normal.    ED Course  Procedures (including critical care time) DIAGNOSTIC STUDIES: Oxygen Saturation is 100% on RA, normal by my interpretation.    COORDINATION OF CARE: 5:01 PM- Pt advised of plan for treatment which includes labs and pt agrees.  Labs Review Labs Reviewed  URINALYSIS, ROUTINE W REFLEX MICROSCOPIC    Imaging Review No results found.    MDM   Final diagnoses:  Preterm labor in third trimester without delivery    The pt is likely in preterm labor - she has contractions.  She needs transfer to facility that has OB as this one does not - accepted by Dr. Marcelline Mates after phone consultation - will arrange transfer to [place] and D.  D/w ER Dr.  Archie Balboa who is aware of pt.  Meds given in ED:  Medications  morphine 4 MG/ML injection 4 mg (not administered)  sodium chloride 0.9 % bolus 1,000 mL (not administered)    New Prescriptions   No medications on file      I personally performed the services described in this documentation, which was scribed in my presence. The recorded information has been reviewed and is accurate.        Johnna Acosta, MD 11/06/13 [phone]

## 2013-11-06 NOTE — Progress Notes (Signed)
Received call from [facility] staff. Pt is a G3P2 at 35 1/7 weeks with c/o lower pelvic pressure and intermittent cramping since 2 am. Denies vaginal bleeding or leaking of fluid.

## 2013-11-06 NOTE — Progress Notes (Signed)
EFM dc'd by [facility] staff. Pt transferred to [facility] by [facility].

## 2013-11-07 LAB — GC/CHLAMYDIA PROBE AMP

## 2013-11-09 LAB — BETA STREP CULTURE(ARMC)

## 2013-11-13 ENCOUNTER — Encounter (HOSPITAL_COMMUNITY): Payer: Self-pay | Admitting: Emergency Medicine

## 2013-12-03 ENCOUNTER — Inpatient Hospital Stay: Payer: Self-pay | Admitting: Obstetrics and Gynecology

## 2013-12-04 LAB — CBC WITH DIFFERENTIAL/PLATELET
Basophil #: 0 10*3/uL (ref 0.0–0.1)
Basophil %: 0.2 %
EOS ABS: 0.1 10*3/uL (ref 0.0–0.7)
Eosinophil %: 0.7 %
HCT: 33 % — ABNORMAL LOW (ref 35.0–47.0)
HGB: 11.2 g/dL — ABNORMAL LOW (ref 12.0–16.0)
LYMPHS ABS: 2.1 10*3/uL (ref 1.0–3.6)
Lymphocyte %: 16.5 %
MCH: 30.2 pg (ref 26.0–34.0)
MCHC: 33.9 g/dL (ref 32.0–36.0)
MCV: 89 fL (ref 80–100)
MONOS PCT: 4.4 %
Monocyte #: 0.6 x10 3/mm (ref 0.2–0.9)
NEUTROS PCT: 78.2 %
Neutrophil #: 9.8 10*3/uL — ABNORMAL HIGH (ref 1.4–6.5)
PLATELETS: 210 10*3/uL (ref 150–440)
RBC: 3.7 10*6/uL — ABNORMAL LOW (ref 3.80–5.20)
RDW: 14.5 % (ref 11.5–14.5)
WBC: 12.6 10*3/uL — ABNORMAL HIGH (ref 3.6–11.0)

## 2013-12-05 LAB — HEMATOCRIT: HCT: 33.4 % — ABNORMAL LOW (ref 35.0–47.0)

## 2014-05-22 NOTE — H&P (Signed)
L&D Evaluation:  History:  HPI Ms. Donna Vaughan is a 34 y.o. [id number] single AAF, LMP 03/05/13, EGA 39.1 weeks, EDD 12/09/13 who presented for IOL for cHTN.   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, h/o miscarriage x 1   Patient's Surgical History Colecystectomy   Medications Pre Natal Vitamins  Norco (used sparingly)   Allergies NKDA   Social History none   Family History Non-Contributory   ROS:  General normal   HEENT normal   CNS normal   GI normal   GU contractions, pelvic pressure   Resp normal   CV normal   Renal normal   MS normal   Exam:  Vital Signs stable   Urine Protein not completed   General no apparent distress   Mental Status clear   Chest clear   Heart normal sinus rhythm, no murmur/gallop/rubs   Abdomen gravid, non-tender   Estimated Fetal Weight Average for gestational age   Fetal Position cephalic   Back no CVAT   Edema no edema   Pelvic no external lesions, 1.5/30/c/-3/posterior   Mebranes Intact   FHT normal rate with no decels   Fetal Heart Rate 145   Ucx irregular, previously q 3-5 min prior to hydration, now dissipated to q 20-30 min   Ucx Pain Scale 3   Skin dry   Lymph no lymphadenopathy   Other O+/-/HIV-/ND/NR/RI/VZI/GC-/Cl-/GBS- 12/04/13: 12.6>11.2/33.0<210   Impression:  Impression dehydration, cHTN in pregnancy at term   Plan:  Plan EFM/NST, monitor contractions and for cervical change, monitor BP, fluids   Comments Induction of labor.  Initiated on Cervidil overnight.  Cervix unchanged this a.m. after 8 hrs.  Cervidil removed and Cytotec placed.  To begin Pitocin once Cytotec discontinued.  Patient with dehydration as it took multiple sticks to establish IV access.  Improved with IV hydration. Desires BTL postpartum.   Electronic Signatures: Augusto Gamble (MD)  (Signed [phone] 13:46)  Authored: L&D  Evaluation   Last Updated: 23-Nov-15 13:46 by Augusto Gamble (MD)

## 2014-08-02 ENCOUNTER — Ambulatory Visit (INDEPENDENT_AMBULATORY_CARE_PROVIDER_SITE_OTHER): Payer: Medicaid Other | Admitting: Obstetrics and Gynecology

## 2014-08-02 ENCOUNTER — Other Ambulatory Visit: Payer: Self-pay | Admitting: Obstetrics and Gynecology

## 2014-08-02 ENCOUNTER — Encounter: Payer: Self-pay | Admitting: Obstetrics and Gynecology

## 2014-08-02 VITALS — BP 156/109 | HR 120 | Ht 65.0 in | Wt 281.4 lb

## 2014-08-02 DIAGNOSIS — Z86018 Personal history of other benign neoplasm: Secondary | ICD-10-CM

## 2014-08-02 DIAGNOSIS — Z8742 Personal history of other diseases of the female genital tract: Secondary | ICD-10-CM

## 2014-08-02 DIAGNOSIS — D251 Intramural leiomyoma of uterus: Secondary | ICD-10-CM | POA: Diagnosis not present

## 2014-08-02 DIAGNOSIS — N939 Abnormal uterine and vaginal bleeding, unspecified: Secondary | ICD-10-CM | POA: Diagnosis not present

## 2014-08-02 LAB — POCT URINE PREGNANCY: Preg Test, Ur: NEGATIVE

## 2014-08-02 MED ORDER — HYDROCHLOROTHIAZIDE 12.5 MG PO CAPS: 12.5000 mg | ORAL_CAPSULE | Freq: Every day | ORAL | Status: DC

## 2014-08-02 MED ORDER — MEDROXYPROGESTERONE ACETATE 10 MG PO TABS: 10.0000 mg | ORAL_TABLET | Freq: Every day | ORAL | Status: DC

## 2014-08-04 DIAGNOSIS — D259 Leiomyoma of uterus, unspecified: Secondary | ICD-10-CM | POA: Insufficient documentation

## 2014-08-04 HISTORY — DX: Leiomyoma of uterus, unspecified: D25.9

## 2014-08-04 NOTE — Progress Notes (Signed)
GYNECOLOGY PROGRESS NOTE  Subjective:    Patient ID: Donna Vaughan, female    DOB: [birth year], 34 y.o.   MRN: [id number]  HPI  Patient is a 34 y.o. [phone] female who presents for discussion of management options for abnormal uterine bleeding.  Patient has had ongoing abnormal uterine bleeding x 2-3 years.  Was initially considering hysterectomy prior to most recent pregnancy.  Prior workup was negative except for evidence of known fibroids on ultrasound. However now desires a less definitive approach.  Notes that menses are heavy and prolonged, lasting 2 weeks at a time and going through 1 pack of pads q 3-4 days.  Also reports associated dysmenorrhea for which she is taking Ibuprofen.    Past Medical History  Diagnosis Date  . Vitamin D insufficiency   . Hypertension   . Anemia   . Pregnant   . Fibroids   . Anxiety   . Depression   . Morbid obesity    Past Surgical History  Procedure Laterality Date  . Cholecystectomy    . Foot surgery    . Tonsillectomy    . Adenoidectomy     History  Substance Use Topics  . Smoking status: Never Smoker   . Smokeless tobacco: Never Used  . Alcohol Use: No   Meds:  No current outpatient prescriptions on file prior to visit.    Allg: Allergies  Allergen Reactions  . Aspirin Nausea And Vomiting    All other history reviewed and updated as appropriate in chart.   Review of Systems A comprehensive review of systems was negative except for: Genitourinary: positive for abnormal menstrual periods and dysmenorrhea   Denies fatigue, SOB, chest pain.   Objective:   Blood pressure 156/109, pulse 120, height 5\' 5"  (1.651 m), weight 281 lb 6.4 oz (127.642 kg), last menstrual period 08/02/2014, unknown if currently breastfeeding. General appearance: alert and no distress Abdomen: Exam deferred today at patient's request.     Assessment:   H/o fibroid uterus H/o abnormal uterine bleeding.  HTN (uncontrolled)  Plan:   Patient has  abnormal uterine bleeding .  Will order abnormal uterine bleeding evaluation labs and pelvic ultrasound to evaluate for any structural gynecologic abnormalities (as patient's last workup was 2015, with sono performed in 2014).   TSH in 2014 normal.  Discussion had regarding management options. Declines hysterectomy at this time.  Patient initially desired endometrial ablation, but notes that she is starting a new job and will not be able to take off work for the surgery and recovery time for at least several months. Also was upset that the ablation did not also treat her desire for cessation of further childbearing and would still need contraception. Has tried Depo Provera and OCPs in the past with noticeable weight gain.  Declined use of Lysteda.  Discussed Mirena IUD as an option.  After discussion of risks, benefits, patient is willing to try IUD.  To f/u in 1 week for repeat ultrasound and can place IUD at next visit as long as fibroids do not invade uterine cavity.  Will initiate on Provera tablets to decrease bleeding.  UPT performed, negative today.  HTN uncontrolled - patient notes that she has not needed to take any BP meds since prior to pregnancy.  BPs elevated today. To resume previous meds (HCTZ 12.5 mg daily).  New prescription given  A total of 30 minutes were spent face-to-face with the patient during this encounter and over half of that time involved counseling and  coordination of care.    Rubie Maid, MD [facility]

## 2014-08-17 ENCOUNTER — Ambulatory Visit: Payer: Medicaid Other

## 2014-08-17 ENCOUNTER — Ambulatory Visit (INDEPENDENT_AMBULATORY_CARE_PROVIDER_SITE_OTHER): Payer: Medicaid Other | Admitting: Obstetrics and Gynecology

## 2014-08-17 ENCOUNTER — Encounter: Payer: Self-pay | Admitting: Obstetrics and Gynecology

## 2014-08-17 VITALS — BP 149/99 | HR 94 | Ht 65.0 in | Wt 280.0 lb

## 2014-08-17 DIAGNOSIS — Z30430 Encounter for insertion of intrauterine contraceptive device: Secondary | ICD-10-CM

## 2014-08-17 NOTE — Progress Notes (Signed)
    GYNECOLOGY CLINIC PROCEDURE NOTE  Donna Vaughan is a 34 y.o. [phone] here for Mirena IUD insertion. No GYN concerns.   IUD Insertion Procedure Note Patient identified, informed consent performed, consent signed.   Discussed risks of irregular bleeding, cramping, infection, malpositioning or misplacement of the IUD outside the uterus which may require further procedure such as laparoscopy. Time out was performed.  Urine pregnancy test negative.  Speculum placed in the vagina.  Cervix visualized.  Cleaned with Betadine x 2.  Grasped anteriorly with a single tooth tenaculum.  Uterus sounded to 10 cm.  Mirena IUD placed per manufacturer's recommendations.  Strings trimmed to 3 cm. Tenaculum was removed, good hemostasis noted.  Patient tolerated procedure well.   Patient was given post-procedure instructions.  She was advised to have backup contraception for one week.  Patient was also asked to check IUD strings periodically and follow up in 4 weeks for IUD check.    Rubie Maid, MD [facility]

## 2014-09-04 ENCOUNTER — Telehealth: Payer: Self-pay | Admitting: Obstetrics and Gynecology

## 2014-09-04 NOTE — Telephone Encounter (Signed)
Donna Vaughan still has her cycle going on week 2, and she has the mirena. Got Mirena 3 weeks.

## 2014-09-05 MED ORDER — NORETHIN ACE-ETH ESTRAD-FE 1-20 MG-MCG(24) PO TABS: 1.0000 | ORAL_TABLET | Freq: Every day | ORAL | Status: DC

## 2014-09-05 NOTE — Telephone Encounter (Signed)
Prescription for short trial of OCPs sent in to patient's pharmacy.

## 2014-09-05 NOTE — Telephone Encounter (Signed)
Please inform patient that I advised her at time of insertion that bleeding can be abnormal within the 1st 3 months of having an IUD placed.  If bleeding is very bothersome, can take a dose of combined OCPs x 2 weeks to get menses regulated.

## 2014-09-05 NOTE — Telephone Encounter (Signed)
Donna Vaughan wants an OCP in addition to her IUD to get her cycle regulated.[facility] [place]

## 2014-09-18 ENCOUNTER — Ambulatory Visit: Payer: Medicaid Other | Admitting: Obstetrics and Gynecology

## 2014-10-01 ENCOUNTER — Telehealth: Payer: Self-pay | Admitting: Obstetrics and Gynecology

## 2014-10-02 ENCOUNTER — Ambulatory Visit: Payer: Medicaid Other | Admitting: Obstetrics and Gynecology

## 2014-10-25 NOTE — Telephone Encounter (Signed)
error 

## 2015-05-08 ENCOUNTER — Ambulatory Visit: Payer: Medicaid Other | Admitting: Obstetrics and Gynecology

## 2015-06-13 ENCOUNTER — Ambulatory Visit: Payer: Medicaid Other | Admitting: Obstetrics and Gynecology

## 2015-07-05 ENCOUNTER — Encounter: Payer: Self-pay | Admitting: Obstetrics and Gynecology

## 2015-07-05 ENCOUNTER — Ambulatory Visit (INDEPENDENT_AMBULATORY_CARE_PROVIDER_SITE_OTHER): Payer: Medicaid Other | Admitting: Obstetrics and Gynecology

## 2015-07-05 VITALS — BP 130/92 | Ht 65.0 in | Wt 287.0 lb

## 2015-07-05 DIAGNOSIS — N852 Hypertrophy of uterus: Secondary | ICD-10-CM

## 2015-07-05 DIAGNOSIS — Z3201 Encounter for pregnancy test, result positive: Secondary | ICD-10-CM | POA: Diagnosis not present

## 2015-07-05 DIAGNOSIS — D251 Intramural leiomyoma of uterus: Secondary | ICD-10-CM

## 2015-07-05 DIAGNOSIS — R110 Nausea: Secondary | ICD-10-CM

## 2015-07-05 DIAGNOSIS — N939 Abnormal uterine and vaginal bleeding, unspecified: Secondary | ICD-10-CM

## 2015-07-05 DIAGNOSIS — Z975 Presence of (intrauterine) contraceptive device: Secondary | ICD-10-CM | POA: Diagnosis not present

## 2015-07-05 NOTE — Addendum Note (Signed)
Addended by: Jonnie Kind on: 07/05/2015 03:30 PM   Modules accepted: Orders

## 2015-07-05 NOTE — Progress Notes (Signed)
Patient ID: Donna Vaughan, female   DOB: 1980/07/03, 35 y.o.   MRN: [id number] Pt here today to discuss issues with her IUD. Pt states that she has nausea.

## 2015-07-05 NOTE — Progress Notes (Signed)
   [facility] Visit  07/05/2015            Patient name: Donna Vaughan MRN [id number]  Date of birth: 31-Dec-1980  CC & HPI:  Donna Vaughan is a 35 y.o. female presenting today for follow up with IUD issues and she wants to discuss further options. Pt reports that she has had her IUD since 08/2014, pt LMP was in May and she had 3 days heavy with 5-6 total days of bleeding and that is routine for her. Pt denies having a period this month. Pt has the IUD for contraceptive and period control. Denies having an [place] since placement of her IUD. Pt has associated symptoms of nausea x 2 weeks. Denies any other symptoms.  ROS:  ROS +nausea +amenorrhea +d  Pertinent History Reviewed:   Reviewed: Significant for  Medical         Past Medical History  Diagnosis Date  . Vitamin D insufficiency   . Hypertension   . Anemia   . Pregnant   . Fibroids   . Anxiety   . Depression   . Morbid obesity                               Surgical Hx:    Past Surgical History  Procedure Laterality Date  . Cholecystectomy    . Foot surgery    . Tonsillectomy    . Adenoidectomy     Medications: Reviewed & Updated - see associated section                       Current outpatient prescriptions:  .  hydrochlorothiazide (MICROZIDE) 12.5 MG capsule, Take 1 capsule (12.5 mg total) by mouth daily., Disp: 30 capsule, Rfl: 12 .  Norethindrone Acetate-Ethinyl Estrad-FE (LOESTRIN 24 FE) 1-20 MG-MCG(24) tablet, Take 1 tablet by mouth daily., Disp: 1 Package, Rfl: 2   Social History: Reviewed -  reports that she has never smoked. She has never used smokeless tobacco.  Objective Findings:  Vitals: unknown if currently breastfeeding.  Physical Examination: General appearance - alert, well appearing, and in no distress, oriented to person, place, and time and overweight Mental status - alert, oriented to person, place, and time, normal mood, behavior, speech, dress, motor activity, and thought  processes Chest - clear to auscultation, no wheezes, rales or rhonchi, symmetric air entry Abdomen - soft, nontender, nondistended, no masses or organomegaly Pelvic - VULVA: normal appearing vulva with no masses, tenderness or lesions, VAGINA: normal appearing vagina with normal color and discharge, no lesions, CERVIX: normal appearing cervix without discharge or lesions, IUD string not visible UTERUS: enlarged to 10 week's size exam limited due to patient body habitus urine pregnancy test obtained and positive   Assessment & Plan:   A:  1. No evidence of the IUD 2. Early pregnancy 3. History of uterine fibroids P:  1. Prenatal vitamins, schedule ultrasound and scheduled prenatal 1 visit   By signing my name below, I, Soijett Blue, attest that this documentation has been prepared under the direction and in the presence of Jonnie Kind, MD. Electronically Signed: Soijett Blue, ED Scribe. 07/05/2015. 10:36 AM.  I personally performed the services described in this documentation, which was SCRIBED in my presence. The recorded information has been reviewed and considered accurate. It has been edited as necessary during review. Jonnie Kind, MD

## 2015-07-08 ENCOUNTER — Other Ambulatory Visit: Payer: Medicaid Other

## 2015-07-11 ENCOUNTER — Other Ambulatory Visit: Payer: Medicaid Other

## 2015-07-18 ENCOUNTER — Telehealth: Payer: Self-pay | Admitting: Obstetrics and Gynecology

## 2015-07-18 DIAGNOSIS — O2631 Retained intrauterine contraceptive device in pregnancy, first trimester: Secondary | ICD-10-CM

## 2015-07-18 NOTE — Telephone Encounter (Signed)
Pt called, wanted to talk to Dr. Marcelline Mates. Her IUD popped out and she is currently pregnant.

## 2015-07-19 NOTE — Telephone Encounter (Signed)
Patient calls as she reports that her IUD came out and is now pregnant.  Has had IUD in for 1 year.  Denies actually seeing IUD expulsion. Notes missed menses since May.  Was seen in a local clinic due to complaints of nausea and vomiting.  Had a positive UPT there.  Notes that on exam no strings were noted.  When asked if patient had an ultrasound performed, she stated no.    Advised that she needs to have an ultrasound in order to r/o IUD migration with pregnancy, or if IUD is truly out.  Will transfer to front desk to have patient schedule an appointment for ultrasound.   Rubie Maid, MD [facility]

## 2015-07-22 ENCOUNTER — Other Ambulatory Visit: Payer: Self-pay | Admitting: Obstetrics and Gynecology

## 2015-07-22 DIAGNOSIS — O2631 Retained intrauterine contraceptive device in pregnancy, first trimester: Secondary | ICD-10-CM

## 2015-07-24 ENCOUNTER — Other Ambulatory Visit: Payer: Medicaid Other

## 2015-07-24 ENCOUNTER — Encounter: Payer: Medicaid Other | Admitting: Women's Health

## 2015-08-12 ENCOUNTER — Encounter: Payer: Self-pay | Admitting: Obstetrics and Gynecology

## 2015-08-12 ENCOUNTER — Telehealth: Payer: Self-pay | Admitting: Obstetrics and Gynecology

## 2015-08-12 NOTE — Telephone Encounter (Signed)
Pt dismissed from practice per office protocol. Provider aware.

## 2015-08-12 NOTE — Telephone Encounter (Signed)
Called pt on 08/12/15 to inform her that she was dismissed from our practice, I did tell her she will remain under our care for the next 30 days until she found another provider and if she had a true emergency within those 30 days she can call us, pt was understanding. Dismissal letter has been printed and pts chart and appt desk has been documented that she is dismissed

## 2015-08-14 ENCOUNTER — Other Ambulatory Visit: Payer: Self-pay | Admitting: Obstetrics and Gynecology

## 2015-08-14 DIAGNOSIS — O3680X0 Pregnancy with inconclusive fetal viability, not applicable or unspecified: Secondary | ICD-10-CM

## 2015-08-15 ENCOUNTER — Ambulatory Visit (INDEPENDENT_AMBULATORY_CARE_PROVIDER_SITE_OTHER): Payer: Medicaid Other

## 2015-08-15 ENCOUNTER — Other Ambulatory Visit: Payer: Self-pay | Admitting: *Deleted

## 2015-08-15 ENCOUNTER — Other Ambulatory Visit: Payer: Self-pay | Admitting: Obstetrics and Gynecology

## 2015-08-15 DIAGNOSIS — O99212 Obesity complicating pregnancy, second trimester: Secondary | ICD-10-CM | POA: Diagnosis not present

## 2015-08-15 DIAGNOSIS — O3412 Maternal care for benign tumor of corpus uteri, second trimester: Secondary | ICD-10-CM | POA: Diagnosis not present

## 2015-08-15 DIAGNOSIS — O3680X0 Pregnancy with inconclusive fetal viability, not applicable or unspecified: Secondary | ICD-10-CM | POA: Diagnosis not present

## 2015-08-15 DIAGNOSIS — Z3A14 14 weeks gestation of pregnancy: Secondary | ICD-10-CM

## 2015-08-15 MED ORDER — DOXYLAMINE-PYRIDOXINE 10-10 MG PO TBEC: 10.0000 mg | DELAYED_RELEASE_TABLET | ORAL | 0 refills | Status: DC

## 2015-08-15 NOTE — Progress Notes (Signed)
[place] TA/TV:  13+3 wks,single IUP w/ys,pos fht 172 bpm,normal ov's bilat,mult fibroids (#1) ant 4.2 x 3.2 x 4.2 cm,(#2) post 2.9 x 2.8 x 2.4 cm,uterine calcification pos.lower uterine segment,NO IUD seen,crl 74.1 mm,unable to obtain NT because of pt body habitus.

## 2015-08-28 ENCOUNTER — Encounter: Payer: Self-pay | Admitting: Women's Health

## 2015-08-28 ENCOUNTER — Ambulatory Visit (INDEPENDENT_AMBULATORY_CARE_PROVIDER_SITE_OTHER): Payer: Medicaid Other | Admitting: Women's Health

## 2015-08-28 VITALS — BP 144/104 | HR 64 | Wt 284.0 lb

## 2015-08-28 DIAGNOSIS — O10912 Unspecified pre-existing hypertension complicating pregnancy, second trimester: Secondary | ICD-10-CM

## 2015-08-28 DIAGNOSIS — O09899 Supervision of other high risk pregnancies, unspecified trimester: Secondary | ICD-10-CM | POA: Insufficient documentation

## 2015-08-28 DIAGNOSIS — O210 Mild hyperemesis gravidarum: Secondary | ICD-10-CM

## 2015-08-28 DIAGNOSIS — O99212 Obesity complicating pregnancy, second trimester: Secondary | ICD-10-CM

## 2015-08-28 DIAGNOSIS — Z3682 Encounter for antenatal screening for nuchal translucency: Secondary | ICD-10-CM

## 2015-08-28 DIAGNOSIS — Z3A16 16 weeks gestation of pregnancy: Secondary | ICD-10-CM | POA: Diagnosis not present

## 2015-08-28 DIAGNOSIS — O10919 Unspecified pre-existing hypertension complicating pregnancy, unspecified trimester: Secondary | ICD-10-CM

## 2015-08-28 DIAGNOSIS — Z369 Encounter for antenatal screening, unspecified: Secondary | ICD-10-CM

## 2015-08-28 DIAGNOSIS — Z1389 Encounter for screening for other disorder: Secondary | ICD-10-CM | POA: Diagnosis not present

## 2015-08-28 DIAGNOSIS — Z331 Pregnant state, incidental: Secondary | ICD-10-CM

## 2015-08-28 DIAGNOSIS — O09892 Supervision of other high risk pregnancies, second trimester: Secondary | ICD-10-CM | POA: Diagnosis not present

## 2015-08-28 DIAGNOSIS — Z6841 Body Mass Index (BMI) 40.0 and over, adult: Secondary | ICD-10-CM

## 2015-08-28 DIAGNOSIS — Z0283 Encounter for blood-alcohol and blood-drug test: Secondary | ICD-10-CM

## 2015-08-28 LAB — POCT URINALYSIS DIPSTICK
Glucose, UA: NEGATIVE
Ketones, UA: NEGATIVE
Leukocytes, UA: NEGATIVE
Nitrite, UA: NEGATIVE

## 2015-08-28 MED ORDER — LABETALOL HCL 200 MG PO TABS: 200.0000 mg | ORAL_TABLET | Freq: Two times a day (BID) | ORAL | 3 refills | Status: DC

## 2015-08-28 MED ORDER — PROMETHAZINE HCL 25 MG PO TABS: 12.5000 mg | ORAL_TABLET | Freq: Four times a day (QID) | ORAL | 0 refills | Status: DC | PRN

## 2015-08-28 NOTE — Progress Notes (Signed)
Subjective:  Donna Vaughan is a 35 y.o. [id number] African American female at [redacted]w[redacted]d by 13wk u/s, being seen today for her first obstetrical visit.  Her obstetrical history is significant for term uncomplicated svb x 3, had HTN during each pregnancy- states usually not present outside of pregnancy; came to discuss BTL few weeks ago, had +PT, had IUD placed 12yr ago-she does not recall it falling out and was not visible on dating u/s; obese.  Pregnancy history fully reviewed.  Patient reports n/v- taking 4 diclegis which helps some but still not completely- requests something different. Denies vb, cramping, uti s/s, abnormal/malodorous vag d/c, or vulvovaginal itching/irritation.  BP (!) 144/104   Pulse 64   Wt 284 lb (128.8 kg)   LMP 05/20/2015 (Approximate)   BMI 47.26 kg/m   HISTORY: OB History  Gravida Para Term Preterm AB Living  5 3 3   1 3   SAB TAB Ectopic Multiple Live Births  1       3    # Outcome Date GA Lbr Len/2nd Weight Sex Delivery Anes PTL Lv  5 Current           4 Term 12/04/13 [redacted]w[redacted]d  6 lb 11 oz (3.033 kg) M Vag-Spont EPI N LIV  3 SAB 01/2013     SAB   FD  2 Term 11/09/07 [redacted]w[redacted]d  6 lb 12 oz (3.062 kg) M Vag-Spont EPI N LIV  1 Term 02/10/99 [redacted]w[redacted]d  8 lb 3 oz (3.714 kg) M Vag-Spont  N LIV     Past Medical History:  Diagnosis Date  . Anemia   . Anxiety   . Depression   . Fibroids   . Hypertension   . Morbid obesity ([place])   . Pregnant   . Vitamin D insufficiency    Past Surgical History:  Procedure Laterality Date  . ADENOIDECTOMY    . CHOLECYSTECTOMY    . FOOT SURGERY    . TONSILLECTOMY     Family History  Problem Relation Age of Onset  . Hypertension Brother   . Asthma Son   . Diabetes Maternal Uncle   . Stroke Maternal Grandfather   . Hypertension Paternal Grandmother   . Heart disease Paternal Grandmother     CHF  . COPD Son     bronchitis    Exam   System:     General: Well developed & nourished, no acute distress   Skin: Warm & dry, normal  coloration and turgor, no rashes   Neurologic: Alert & oriented, normal mood   Cardiovascular: Regular rate & rhythm   Respiratory: Effort & rate normal, LCTAB, acyanotic   Abdomen: Soft, non tender   Extremities: normal strength, tone  Thin prep pap smear up to date- neg  FHR: + via doppler   Assessment:   Pregnancy: [id number] Patient Active Problem List   Diagnosis Date Noted  . Chronic hypertension during pregnancy, antepartum 08/28/2015  . Supervision of other high-risk pregnancy 08/28/2015  . Fibroid uterus 08/04/2014  . OBESITY 05/30/2007  . HYPERTENSION 05/30/2007  . DYSFUNCTIONAL UTERINE BLEEDING 05/30/2007    [redacted]w[redacted]d [id number] New OB visit CHTN- no meds currently Obese N/V of pregnancy Unintended pregnancy, thought IUD was still in place  Plan:  Initial labs drawn including CMP and 24hr urine Begin baby ASA daily Continue prenatal vitamins Problem list reviewed and updated Reviewed n/v relief measures and warning s/s to report Reviewed recommended weight gain based on pre-gravid BMI Encouraged well-balanced diet Genetic Screening discussed [place]  Screen: requested Cystic fibrosis screening discussed declined Ultrasound discussed; fetal survey: requested Follow up in 2 weeks for HROB/bp check CCNC completed Rx Labetalol 200mg  BID Rx phenergan for n/v- can stop diclegis  Tawnya Crook CNM, [facility] 08/28/2015 5:21 PM

## 2015-08-28 NOTE — Patient Instructions (Signed)
Begin taking a 81mg baby aspirin daily at 12 weeks of pregnancy to decrease risk of preeclampsia during pregnancy    Nausea & Vomiting  Have saltine crackers or pretzels by your bed and eat a few bites before you raise your head out of bed in the morning  Eat small frequent meals throughout the day instead of large meals  Drink plenty of fluids throughout the day to stay hydrated, just don't drink a lot of fluids with your meals.  This can make your stomach fill up faster making you feel sick  Do not brush your teeth right after you eat  Products with real ginger are good for nausea, like ginger ale and ginger hard candy Make sure it says made with real ginger!  Sucking on sour candy like lemon heads is also good for nausea  If your prenatal vitamins make you nauseated, take them at night so you will sleep through the nausea  Sea Bands  If you feel like you need medicine for the nausea & vomiting please let us know  If you are unable to keep any fluids or food down please let us know   Constipation  Drink plenty of fluid, preferably water, throughout the day  Eat foods high in fiber such as fruits, vegetables, and grains  Exercise, such as walking, is a good way to keep your bowels regular  Drink warm fluids, especially warm prune juice, or decaf coffee  Eat a 1/2 cup of real oatmeal (not instant), 1/2 cup applesauce, and 1/2-1 cup warm prune juice every day  If needed, you may take Colace (docusate sodium) stool softener once or twice a day to help keep the stool soft. If you are pregnant, wait until you are out of your first trimester (12-14 weeks of pregnancy)  If you still are having problems with constipation, you may take Miralax once daily as needed to help keep your bowels regular.  If you are pregnant, wait until you are out of your first trimester (12-14 weeks of pregnancy)   First Trimester of Pregnancy The first trimester of pregnancy is from week 1 until the end  of week 12 (months 1 through 3). A week after a sperm fertilizes an egg, the egg will implant on the wall of the uterus. This embryo will begin to develop into a baby. Genes from you and your partner are forming the baby. The female genes determine whether the baby is a boy or a girl. At 6-8 weeks, the eyes and face are formed, and the heartbeat can be seen on ultrasound. At the end of 12 weeks, all the baby's organs are formed.  Now that you are pregnant, you will want to do everything you can to have a healthy baby. Two of the most important things are to get good prenatal care and to follow your health care provider's instructions. Prenatal care is all the medical care you receive before the baby's birth. This care will help prevent, find, and treat any problems during the pregnancy and childbirth. BODY CHANGES Your body goes through many changes during pregnancy. The changes vary from woman to woman.   You may gain or lose a couple of pounds at first.  You may feel sick to your stomach (nauseous) and throw up (vomit). If the vomiting is uncontrollable, call your health care provider.  You may tire easily.  You may develop headaches that can be relieved by medicines approved by your health care provider.  You may urinate   more often. Painful urination may mean you have a bladder infection.  You may develop heartburn as a result of your pregnancy.  You may develop constipation because certain hormones are causing the muscles that push waste through your intestines to slow down.  You may develop hemorrhoids or swollen, bulging veins (varicose veins).  Your breasts may begin to grow larger and become tender. Your nipples may stick out more, and the tissue that surrounds them (areola) may become darker.  Your gums may bleed and may be sensitive to brushing and flossing.  Dark spots or blotches (chloasma, mask of pregnancy) may develop on your face. This will likely fade after the baby is  born.  Your menstrual periods will stop.  You may have a loss of appetite.  You may develop cravings for certain kinds of food.  You may have changes in your emotions from day to day, such as being excited to be pregnant or being concerned that something may go wrong with the pregnancy and baby.  You may have more vivid and strange dreams.  You may have changes in your hair. These can include thickening of your hair, rapid growth, and changes in texture. Some women also have hair loss during or after pregnancy, or hair that feels dry or thin. Your hair will most likely return to normal after your baby is born. WHAT TO EXPECT AT YOUR PRENATAL VISITS During a routine prenatal visit:  You will be weighed to make sure you and the baby are growing normally.  Your blood pressure will be taken.  Your abdomen will be measured to track your baby's growth.  The fetal heartbeat will be listened to starting around week 10 or 12 of your pregnancy.  Test results from any previous visits will be discussed. Your health care provider may ask you:  How you are feeling.  If you are feeling the baby move.  If you have had any abnormal symptoms, such as leaking fluid, bleeding, severe headaches, or abdominal cramping.  If you have any questions. Other tests that may be performed during your first trimester include:  Blood tests to find your blood type and to check for the presence of any previous infections. They will also be used to check for low iron levels (anemia) and Rh antibodies. Later in the pregnancy, blood tests for diabetes will be done along with other tests if problems develop.  Urine tests to check for infections, diabetes, or protein in the urine.  An ultrasound to confirm the proper growth and development of the baby.  An amniocentesis to check for possible genetic problems.  Fetal screens for spina bifida and Down syndrome.  You may need other tests to make sure you and the  baby are doing well. HOME CARE INSTRUCTIONS  Medicines  Follow your health care provider's instructions regarding medicine use. Specific medicines may be either safe or unsafe to take during pregnancy.  Take your prenatal vitamins as directed.  If you develop constipation, try taking a stool softener if your health care provider approves. Diet  Eat regular, well-balanced meals. Choose a variety of foods, such as meat or vegetable-based protein, fish, milk and low-fat dairy products, vegetables, fruits, and whole grain breads and cereals. Your health care provider will help you determine the amount of weight gain that is right for you.  Avoid raw meat and uncooked cheese. These carry germs that can cause birth defects in the baby.  Eating four or five small meals rather than three   large meals a day may help relieve nausea and vomiting. If you start to feel nauseous, eating a few soda crackers can be helpful. Drinking liquids between meals instead of during meals also seems to help nausea and vomiting.  If you develop constipation, eat more high-fiber foods, such as fresh vegetables or fruit and whole grains. Drink enough fluids to keep your urine clear or pale yellow. Activity and Exercise  Exercise only as directed by your health care provider. Exercising will help you:  Control your weight.  Stay in shape.  Be prepared for labor and delivery.  Experiencing pain or cramping in the lower abdomen or low back is a good sign that you should stop exercising. Check with your health care provider before continuing normal exercises.  Try to avoid standing for long periods of time. Move your legs often if you must stand in one place for a long time.  Avoid heavy lifting.  Wear low-heeled shoes, and practice good posture.  You may continue to have sex unless your health care provider directs you otherwise. Relief of Pain or Discomfort  Wear a good support bra for breast tenderness.    Take warm sitz baths to soothe any pain or discomfort caused by hemorrhoids. Use hemorrhoid cream if your health care provider approves.   Rest with your legs elevated if you have leg cramps or low back pain.  If you develop varicose veins in your legs, wear support hose. Elevate your feet for 15 minutes, 3-4 times a day. Limit salt in your diet. Prenatal Care  Schedule your prenatal visits by the twelfth week of pregnancy. They are usually scheduled monthly at first, then more often in the last 2 months before delivery.  Write down your questions. Take them to your prenatal visits.  Keep all your prenatal visits as directed by your health care provider. Safety  Wear your seat belt at all times when driving.  Make a list of emergency phone numbers, including numbers for family, friends, the hospital, and police and fire departments. General Tips  Ask your health care provider for a referral to a local prenatal education class. Begin classes no later than at the beginning of month 6 of your pregnancy.  Ask for help if you have counseling or nutritional needs during pregnancy. Your health care provider can offer advice or refer you to specialists for help with various needs.  Do not use hot tubs, steam rooms, or saunas.  Do not douche or use tampons or scented sanitary pads.  Do not cross your legs for long periods of time.  Avoid cat litter boxes and soil used by cats. These carry germs that can cause birth defects in the baby and possibly loss of the fetus by miscarriage or stillbirth.  Avoid all smoking, herbs, alcohol, and medicines not prescribed by your health care provider. Chemicals in these affect the formation and growth of the baby.  Schedule a dentist appointment. At home, brush your teeth with a soft toothbrush and be gentle when you floss. SEEK MEDICAL CARE IF:   You have dizziness.  You have mild pelvic cramps, pelvic pressure, or nagging pain in the abdominal  area.  You have persistent nausea, vomiting, or diarrhea.  You have a bad smelling vaginal discharge.  You have pain with urination.  You notice increased swelling in your face, hands, legs, or ankles. SEEK IMMEDIATE MEDICAL CARE IF:   You have a fever.  You are leaking fluid from your vagina.  You   have spotting or bleeding from your vagina.  You have severe abdominal cramping or pain.  You have rapid weight gain or loss.  You vomit blood or material that looks like coffee grounds.  You are exposed to German measles and have never had them.  You are exposed to fifth disease or chickenpox.  You develop a severe headache.  You have shortness of breath.  You have any kind of trauma, such as from a fall or a car accident. Document Released: 12/23/2000 Document Revised: 05/15/2013 Document Reviewed: 11/08/2012 ExitCare Patient Information 2015 [employer]. This information is not intended to replace advice given to you by your health care provider. Make sure you discuss any questions you have with your health care provider.   

## 2015-08-29 LAB — PMP SCREEN PROFILE (10S), URINE

## 2015-08-30 LAB — CBC
Hematocrit: 34.7 % (ref 34.0–46.6)
Hemoglobin: 11.6 g/dL (ref 11.1–15.9)
MCH: 27.6 pg (ref 26.6–33.0)
MCHC: 33.4 g/dL (ref 31.5–35.7)
MCV: 83 fL (ref 79–97)
PLATELETS: 295 10*3/uL (ref 150–379)
RBC: 4.2 x10E6/uL (ref 3.77–5.28)
RDW: 17.9 % — AB (ref 12.3–15.4)
WBC: 13.6 10*3/uL — AB (ref 3.4–10.8)

## 2015-08-30 LAB — VARICELLA ZOSTER ANTIBODY, IGG: Varicella zoster IgG: 3944 index (ref >165)

## 2015-08-30 LAB — MICROSCOPIC EXAMINATION: CASTS: NONE SEEN /LPF

## 2015-08-30 LAB — URINALYSIS, ROUTINE W REFLEX MICROSCOPIC
Bilirubin, UA: NEGATIVE
GLUCOSE, UA: NEGATIVE
NITRITE UA: NEGATIVE
RBC, UA: NEGATIVE
SPEC GRAV UA: 1.023 (ref 1.005–1.030)
Urobilinogen, Ur: 1 mg/dL (ref 0.2–1.0)
pH, UA: 6 (ref 5.0–7.5)

## 2015-08-30 LAB — COMPREHENSIVE METABOLIC PANEL
A/G RATIO: 1.2 (ref 1.2–2.2)
ALT: 13 IU/L (ref 0–32)
AST: 14 IU/L (ref 0–40)
Albumin: 3.8 g/dL (ref 3.5–5.5)
Alkaline Phosphatase: 60 IU/L (ref 39–117)
BUN/Creatinine Ratio: 9 (ref 9–23)
BUN: 6 mg/dL (ref 6–20)
Bilirubin Total: 0.3 mg/dL (ref 0.0–1.2)
CALCIUM: 9.2 mg/dL (ref 8.7–10.2)
CO2: 16 mmol/L — AB (ref 18–29)
CREATININE: 0.67 mg/dL (ref 0.57–1.00)
Chloride: 100 mmol/L (ref 96–106)
GFR, EST AFRICAN AMERICAN: 132 mL/min/{1.73_m2} (ref >59)
GFR, EST NON AFRICAN AMERICAN: 114 mL/min/{1.73_m2} (ref >59)
GLUCOSE: 83 mg/dL (ref 65–99)
Globulin, Total: 3.3 g/dL (ref 1.5–4.5)
POTASSIUM: 4.2 mmol/L (ref 3.5–5.2)
Sodium: 137 mmol/L (ref 134–144)
TOTAL PROTEIN: 7.1 g/dL (ref 6.0–8.5)

## 2015-08-30 LAB — HEPATITIS B SURFACE ANTIGEN: HEP B S AG: NEGATIVE

## 2015-08-30 LAB — ABO/RH: Rh Factor: POSITIVE

## 2015-08-30 LAB — RPR: RPR: NONREACTIVE

## 2015-08-30 LAB — HIV ANTIBODY (ROUTINE TESTING W REFLEX): HIV SCREEN 4TH GENERATION: NONREACTIVE

## 2015-08-30 LAB — ANTIBODY SCREEN: Antibody Screen: NEGATIVE

## 2015-08-30 LAB — RUBELLA SCREEN: Rubella Antibodies, IGG: 1.76 index (ref >0.99)

## 2015-09-01 LAB — AFP, QUAD SCREEN
DIA MOM VALUE: 13.48
DIA Value (EIA): 1808.53 pg/mL
DSR (BY AGE) 1 IN: 223
DSR (SECOND TRIMESTER) 1 IN: 11
Gestational Age: 15.3 WEEKS
MATERNAL AGE AT EDD: 36.3 a
MSAFP MOM: 1.26
MSAFP: 28.1 ng/mL
MSHCG Mom: 6.06
MSHCG: 205228 m[IU]/mL
OSB RISK: 10000
PDF: 0
T18 (By Age): 1:867 {titer}
Test Results:: POSITIVE — AB
UE3 MOM: 0.48
UE3 VALUE: 0.27 ng/mL
Weight: 284 [lb_av]

## 2015-09-02 ENCOUNTER — Telehealth: Payer: Self-pay | Admitting: Women's Health

## 2015-09-02 ENCOUNTER — Other Ambulatory Visit: Payer: Self-pay | Admitting: *Deleted

## 2015-09-02 DIAGNOSIS — O285 Abnormal chromosomal and genetic finding on antenatal screening of mother: Secondary | ICD-10-CM

## 2015-09-02 NOTE — Telephone Encounter (Signed)
Pt informed of NTIT results of increased risk for Down Syndrome, Informaseq offered and  ordered and pt states will go to [facility] tomorrow to have blood work done.

## 2015-09-02 NOTE — Telephone Encounter (Signed)
Called pt, voicemail not set up. Need to notify of elevated r/f down syndrome, offer Informaseq.  Roma Schanz, CNM, [facility] 09/02/2015 1:49 PM

## 2015-09-04 ENCOUNTER — Other Ambulatory Visit: Payer: Self-pay | Admitting: *Deleted

## 2015-09-04 DIAGNOSIS — O285 Abnormal chromosomal and genetic finding on antenatal screening of mother: Secondary | ICD-10-CM

## 2015-09-05 LAB — PROTEIN, URINE, 24 HOUR
PROTEIN 24H UR: 68 mg/(24.h) (ref 30–150)
Protein, Ur: 13 mg/dL

## 2015-09-10 ENCOUNTER — Telehealth: Payer: Self-pay | Admitting: Women's Health

## 2015-09-10 LAB — INFORMASEQ(SM) WITH XY ANALYSIS
FETAL FRACTION (%): 5.5
GESTATIONAL AGE AT COLLECTION: 11.3 wk

## 2015-09-10 NOTE — Telephone Encounter (Signed)
Called pt, notified of Informaseq results- normal female, understands still not diagnostic. Offered referral to genetic counselor, declines.  Roma Schanz, CNM, [facility] 09/10/2015 5:08 PM

## 2015-09-11 ENCOUNTER — Encounter: Payer: Medicaid Other | Admitting: Advanced Practice Midwife

## 2015-09-13 ENCOUNTER — Encounter: Payer: Medicaid Other | Admitting: Obstetrics and Gynecology

## 2015-09-15 ENCOUNTER — Encounter: Payer: Self-pay | Admitting: Obstetrics and Gynecology

## 2015-09-15 DIAGNOSIS — O9989 Other specified diseases and conditions complicating pregnancy, childbirth and the puerperium: Secondary | ICD-10-CM

## 2015-09-15 DIAGNOSIS — Z331 Pregnant state, incidental: Secondary | ICD-10-CM | POA: Insufficient documentation

## 2015-09-15 DIAGNOSIS — T8331XA Breakdown (mechanical) of intrauterine contraceptive device, initial encounter: Secondary | ICD-10-CM | POA: Insufficient documentation

## 2015-09-17 ENCOUNTER — Encounter: Payer: Medicaid Other | Admitting: Women's Health

## 2015-09-18 ENCOUNTER — Ambulatory Visit (INDEPENDENT_AMBULATORY_CARE_PROVIDER_SITE_OTHER): Payer: Medicaid Other | Admitting: Women's Health

## 2015-09-18 ENCOUNTER — Encounter: Payer: Self-pay | Admitting: Women's Health

## 2015-09-18 VITALS — BP 134/82 | HR 88 | Wt 288.0 lb

## 2015-09-18 DIAGNOSIS — O09892 Supervision of other high risk pregnancies, second trimester: Secondary | ICD-10-CM

## 2015-09-18 DIAGNOSIS — Z1389 Encounter for screening for other disorder: Secondary | ICD-10-CM

## 2015-09-18 DIAGNOSIS — Z6841 Body Mass Index (BMI) 40.0 and over, adult: Secondary | ICD-10-CM

## 2015-09-18 DIAGNOSIS — Z118 Encounter for screening for other infectious and parasitic diseases: Secondary | ICD-10-CM

## 2015-09-18 DIAGNOSIS — O10912 Unspecified pre-existing hypertension complicating pregnancy, second trimester: Secondary | ICD-10-CM | POA: Diagnosis not present

## 2015-09-18 DIAGNOSIS — Z363 Encounter for antenatal screening for malformations: Secondary | ICD-10-CM

## 2015-09-18 DIAGNOSIS — Z331 Pregnant state, incidental: Secondary | ICD-10-CM

## 2015-09-18 DIAGNOSIS — Z1159 Encounter for screening for other viral diseases: Secondary | ICD-10-CM

## 2015-09-18 LAB — POCT URINALYSIS DIPSTICK
Blood, UA: NEGATIVE
GLUCOSE UA: NEGATIVE
Ketones, UA: NEGATIVE
NITRITE UA: NEGATIVE

## 2015-09-18 LAB — POCT URINE PREGNANCY: PREG TEST UR: POSITIVE — AB

## 2015-09-18 NOTE — Patient Instructions (Signed)

## 2015-09-18 NOTE — Progress Notes (Signed)
High Risk Pregnancy Diagnosis(es): CHTN [id number] [redacted]w[redacted]d Estimated Date of Delivery: 02/17/16 BP 134/82   Pulse 88   Wt 288 lb (130.6 kg)   LMP 05/20/2015 (Approximate)   BMI 47.93 kg/m   Urinalysis: Positive for 2+ leuks, tr protein HPI:  Doing well, taking Labetalol 200mg  BID as directed BP, weight, and urine reviewed.  Feeling some fm. Denies cramping, lof, vb, uti s/s. No complaints.  Fundal Height:  18wks Fetal Heart rate:  158 Edema:  none  Reviewed warning s/s to report. Discussed hyperechoic structure in cul de sac on u/s- possible extrauterine IUD- will need xray pp prior to salpingectomy/ablation.  All questions were answered Assessment: [redacted]w[redacted]d CHTN Medication(s) Plans:  Continue Labetalol 200mg  BID, baby asa daily Treatment Plan:  Growth u/s @ 20, 28, 34, 38wks     2x/wk testing nst/sono @ 32wks    Deliver 39wks (meds) Follow up in 2wks for high-risk OB appt and anatomy u/s

## 2015-09-19 LAB — GC/CHLAMYDIA PROBE AMP
Chlamydia trachomatis, NAA: NEGATIVE
NEISSERIA GONORRHOEAE BY PCR: NEGATIVE

## 2015-09-20 LAB — URINE CULTURE

## 2015-10-02 ENCOUNTER — Other Ambulatory Visit: Payer: Medicaid Other

## 2015-10-04 ENCOUNTER — Telehealth: Payer: Self-pay | Admitting: Obstetrics & Gynecology

## 2015-10-04 NOTE — Telephone Encounter (Signed)
Pt called stating if it was of for her to take Tylenol 3 being [redacted] weeks pregnant, spoke with nurse and only if pt really has to take the medication she could but recommends for her to take extra strength Tylenol instead. I let the patient know what nurse has explained. Pt still would like a call back from the nurse. Please contact pt

## 2015-10-07 NOTE — Telephone Encounter (Signed)
Pt states she is having oral surgery,  is this safe during pregnancy? Informed pt that our office received a fax from her dental office and was completed by MD and faxed back to dental office. Pt verbalized understanding.

## 2015-10-09 ENCOUNTER — Ambulatory Visit (INDEPENDENT_AMBULATORY_CARE_PROVIDER_SITE_OTHER): Payer: Medicaid Other | Admitting: Obstetrics and Gynecology

## 2015-10-09 ENCOUNTER — Ambulatory Visit (INDEPENDENT_AMBULATORY_CARE_PROVIDER_SITE_OTHER): Payer: Medicaid Other

## 2015-10-09 ENCOUNTER — Encounter: Payer: Self-pay | Admitting: Obstetrics and Gynecology

## 2015-10-09 VITALS — BP 142/100 | HR 66 | Wt 284.4 lb

## 2015-10-09 DIAGNOSIS — O10919 Unspecified pre-existing hypertension complicating pregnancy, unspecified trimester: Secondary | ICD-10-CM

## 2015-10-09 DIAGNOSIS — O10912 Unspecified pre-existing hypertension complicating pregnancy, second trimester: Secondary | ICD-10-CM | POA: Diagnosis not present

## 2015-10-09 DIAGNOSIS — O3412 Maternal care for benign tumor of corpus uteri, second trimester: Secondary | ICD-10-CM | POA: Diagnosis not present

## 2015-10-09 DIAGNOSIS — Z1389 Encounter for screening for other disorder: Secondary | ICD-10-CM

## 2015-10-09 DIAGNOSIS — IMO0002 Reserved for concepts with insufficient information to code with codable children: Secondary | ICD-10-CM

## 2015-10-09 DIAGNOSIS — Z331 Pregnant state, incidental: Secondary | ICD-10-CM | POA: Diagnosis not present

## 2015-10-09 DIAGNOSIS — Z3A22 22 weeks gestation of pregnancy: Secondary | ICD-10-CM | POA: Diagnosis not present

## 2015-10-09 DIAGNOSIS — Z363 Encounter for antenatal screening for malformations: Secondary | ICD-10-CM

## 2015-10-09 LAB — POCT URINALYSIS DIPSTICK
Glucose, UA: NEGATIVE
Ketones, UA: NEGATIVE
LEUKOCYTES UA: NEGATIVE
NITRITE UA: NEGATIVE
RBC UA: NEGATIVE

## 2015-10-09 NOTE — Progress Notes (Signed)
[place] 21+2 wks,cx 5.1 cm,post pl gr 0,mult fibroids, largest fibroid ant 5.4 x 4.3 x 3.7 cm,normal rt ov,lt ov not visualized,svp of fluid 3.5 cm,fhr 158 bpm,EFW 234 g, < 2.3%, limited view of heart,stomach,feet,and face because of fetal pos and pt body habitus,please have pt come back for additional images.

## 2015-10-09 NOTE — Progress Notes (Signed)
Patient ID: Donna Vaughan, female   DOB: Jun 30, 1980, 35 y.o.   MRN: [id number]   High Risk Pregnancy HROB Diagnosis(es):   [facility]  [id number] [redacted]w[redacted]d Estimated Date of Delivery: 02/17/16    BP weight and urine results reviewed and noted. Blood pressure (!) 142/100, pulse 66, weight 284 lb 6.4 oz (129 kg), last menstrual period 05/20/2015, not currently breastfeeding.  Urinalysis: POSITIVE for trace protein   HPI: The patient is being seen today for ongoing management of CHTN.  Chief Complaint  Patient presents with  . Routine Prenatal Visit    ultrasound today     Today she reports she is doing well. Pt states her previous children delivered at a low birth weight.   Patient reports good fetal movement, denies any bleeding and no rupture of membranes symptoms or regular contractions.   Fetal Heart rate:  158 bpm Physical Examination: Abdomen - soft, nontender, nondistended, no masses or organomegaly                                     Edema:  none  Fetal Surveillance Testing today:  Anatomy US  Lab and sonogram results have been reviewed. Comments: early onset IUGR  Assessment:  1.  Pregnancy at [redacted]w[redacted]d,  [id number]   :  Estimated Date of Delivery: 02/17/16                         2.  CHTN                        3. Early onset IUGR   4. Uterine fibroids   Medication(s) Plans:  Labetalol 200mg  BID, baby asa daily  Treatment Plan:  Growth u/s @ 20, 28, 34, 38wks     2x/wk testing nst/sono @ 32wks    Deliver 39wks (meds) 1. Refer for MFM genetic counseling and work up   Follow up in 2 weeks for appointment for high risk OB care   By signing my name below, I, Hansel Feinstein, attest that this documentation has been prepared under the direction and in the presence of Jonnie Kind, MD. Electronically Signed: Hansel Feinstein, ED Scribe. 10/09/15. 11:33 AM.  I personally performed the services described in this documentation, which was SCRIBED in my presence. The recorded information has been  reviewed and considered accurate. It has been edited as necessary during review. Jonnie Kind, MD

## 2015-10-11 DIAGNOSIS — O0990 Supervision of high risk pregnancy, unspecified, unspecified trimester: Secondary | ICD-10-CM | POA: Insufficient documentation

## 2015-10-11 MED ORDER — LABETALOL HCL 200 MG PO TABS: 400.0000 mg | ORAL_TABLET | Freq: Two times a day (BID) | ORAL | 3 refills | Status: DC

## 2015-10-21 ENCOUNTER — Encounter: Payer: Self-pay | Admitting: Women's Health

## 2015-10-21 ENCOUNTER — Encounter (HOSPITAL_COMMUNITY): Payer: Self-pay | Admitting: Obstetrics and Gynecology

## 2015-10-21 ENCOUNTER — Telehealth: Payer: Self-pay | Admitting: Obstetrics and Gynecology

## 2015-10-21 DIAGNOSIS — O365990 Maternal care for other known or suspected poor fetal growth, unspecified trimester, not applicable or unspecified: Secondary | ICD-10-CM

## 2015-10-21 NOTE — Telephone Encounter (Signed)
Phone call to patient, who is to expect a call from [facility] to arrange u/s and consult with [place] . Pt also notes bp remains elevated at 145/90's. Pt to increase labetalol to 400 bid.  Pt has appt here this week, on Wednesday but will work around appt at [facility].

## 2015-10-23 ENCOUNTER — Encounter: Payer: Medicaid Other | Admitting: Obstetrics and Gynecology

## 2015-10-25 ENCOUNTER — Other Ambulatory Visit: Payer: Self-pay | Admitting: *Deleted

## 2015-10-25 DIAGNOSIS — O285 Abnormal chromosomal and genetic finding on antenatal screening of mother: Secondary | ICD-10-CM

## 2015-10-29 ENCOUNTER — Ambulatory Visit (HOSPITAL_COMMUNITY): Admission: RE | Admit: 2015-10-29 | Payer: Medicaid Other | Source: Ambulatory Visit

## 2015-10-29 ENCOUNTER — Inpatient Hospital Stay (HOSPITAL_COMMUNITY): Payer: Medicaid Other | Admitting: Anesthesiology

## 2015-10-29 ENCOUNTER — Encounter (HOSPITAL_COMMUNITY): Payer: Self-pay | Admitting: *Deleted

## 2015-10-29 ENCOUNTER — Ambulatory Visit (HOSPITAL_COMMUNITY)
Admission: RE | Admit: 2015-10-29 | Discharge: 2015-10-29 | Disposition: A | Discharge status: Home / Self Care | Payer: Medicaid Other | Source: Ambulatory Visit | Attending: Obstetrics and Gynecology | Admitting: Obstetrics and Gynecology

## 2015-10-29 ENCOUNTER — Encounter (HOSPITAL_COMMUNITY): Payer: Self-pay

## 2015-10-29 ENCOUNTER — Inpatient Hospital Stay (HOSPITAL_COMMUNITY)
Admission: AD | Admit: 2015-10-29 | Discharge: 2015-10-31 | DRG: 774 | Disposition: A | Discharge status: Home / Self Care | Payer: Medicaid Other | Source: Ambulatory Visit | Attending: Obstetrics & Gynecology | Admitting: Obstetrics & Gynecology

## 2015-10-29 DIAGNOSIS — O321XX0 Maternal care for breech presentation, not applicable or unspecified: Secondary | ICD-10-CM | POA: Diagnosis present

## 2015-10-29 DIAGNOSIS — O1002 Pre-existing essential hypertension complicating childbirth: Secondary | ICD-10-CM | POA: Diagnosis present

## 2015-10-29 DIAGNOSIS — Z833 Family history of diabetes mellitus: Secondary | ICD-10-CM | POA: Diagnosis not present

## 2015-10-29 DIAGNOSIS — Z3A24 24 weeks gestation of pregnancy: Secondary | ICD-10-CM

## 2015-10-29 DIAGNOSIS — O99214 Obesity complicating childbirth: Secondary | ICD-10-CM | POA: Diagnosis present

## 2015-10-29 DIAGNOSIS — O364XX0 Maternal care for intrauterine death, not applicable or unspecified: Secondary | ICD-10-CM | POA: Diagnosis present

## 2015-10-29 DIAGNOSIS — O99212 Obesity complicating pregnancy, second trimester: Secondary | ICD-10-CM

## 2015-10-29 DIAGNOSIS — O365920 Maternal care for other known or suspected poor fetal growth, second trimester, not applicable or unspecified: Secondary | ICD-10-CM | POA: Insufficient documentation

## 2015-10-29 DIAGNOSIS — Z823 Family history of stroke: Secondary | ICD-10-CM | POA: Diagnosis not present

## 2015-10-29 DIAGNOSIS — Z6841 Body Mass Index (BMI) 40.0 and over, adult: Secondary | ICD-10-CM | POA: Diagnosis not present

## 2015-10-29 DIAGNOSIS — O10919 Unspecified pre-existing hypertension complicating pregnancy, unspecified trimester: Secondary | ICD-10-CM

## 2015-10-29 DIAGNOSIS — Z8249 Family history of ischemic heart disease and other diseases of the circulatory system: Secondary | ICD-10-CM

## 2015-10-29 DIAGNOSIS — O10912 Unspecified pre-existing hypertension complicating pregnancy, second trimester: Secondary | ICD-10-CM | POA: Insufficient documentation

## 2015-10-29 DIAGNOSIS — O365990 Maternal care for other known or suspected poor fetal growth, unspecified trimester, not applicable or unspecified: Secondary | ICD-10-CM

## 2015-10-29 DIAGNOSIS — Z975 Presence of (intrauterine) contraceptive device: Secondary | ICD-10-CM

## 2015-10-29 LAB — PROTEIN / CREATININE RATIO, URINE
Creatinine, Urine: 71 mg/dL
Protein Creatinine Ratio: 0.17 mg/mg{Cre} — ABNORMAL HIGH (ref 0.00–0.15)
Total Protein, Urine: 12 mg/dL

## 2015-10-29 LAB — COMPREHENSIVE METABOLIC PANEL
ALBUMIN: 3.4 g/dL — AB (ref 3.5–5.0)
ALT: 16 U/L (ref 14–54)
AST: 17 U/L (ref 15–41)
Alkaline Phosphatase: 51 U/L (ref 38–126)
Anion gap: 10 (ref 5–15)
BUN: 6 mg/dL (ref 6–20)
CHLORIDE: 106 mmol/L (ref 101–111)
CO2: 20 mmol/L — AB (ref 22–32)
CREATININE: 0.7 mg/dL (ref 0.44–1.00)
Calcium: 9.8 mg/dL (ref 8.9–10.3)
GFR calc Af Amer: >60 mL/min (ref >60)
GFR calc non Af Amer: >60 mL/min (ref >60)
GLUCOSE: 87 mg/dL (ref 65–99)
POTASSIUM: 3.7 mmol/L (ref 3.5–5.1)
SODIUM: 136 mmol/L (ref 135–145)
Total Bilirubin: 0.5 mg/dL (ref 0.3–1.2)
Total Protein: 7.1 g/dL (ref 6.5–8.1)

## 2015-10-29 LAB — ABO/RH: ABO/RH(D): O POS

## 2015-10-29 LAB — CBC
HCT: 35.4 % — ABNORMAL LOW (ref 36.0–46.0)
HEMOGLOBIN: 12.5 g/dL (ref 12.0–15.0)
MCH: 30.2 pg (ref 26.0–34.0)
MCHC: 35.3 g/dL (ref 30.0–36.0)
MCV: 85.5 fL (ref 78.0–100.0)
PLATELETS: 257 10*3/uL (ref 150–400)
RBC: 4.14 MIL/uL (ref 3.87–5.11)
RDW: 13.9 % (ref 11.5–15.5)
WBC: 10.8 10*3/uL — ABNORMAL HIGH (ref 4.0–10.5)

## 2015-10-29 LAB — TYPE AND SCREEN
ABO/RH(D): O POS
ANTIBODY SCREEN: NEGATIVE

## 2015-10-29 MED ORDER — MAGNESIUM SULFATE BOLUS VIA INFUSION
4.0000 g | Freq: Once | INTRAVENOUS | Status: AC
  Administered 2015-10-29: 4 g via INTRAVENOUS
  Filled 2015-10-29: qty 500

## 2015-10-29 MED ORDER — ONDANSETRON HCL 4 MG/2ML IJ SOLN: 4.0000 mg | Freq: Four times a day (QID) | INTRAMUSCULAR | Status: DC

## 2015-10-29 MED ORDER — DIPHENHYDRAMINE HCL 50 MG/ML IJ SOLN: 12.5000 mg | INTRAMUSCULAR | Status: DC | PRN

## 2015-10-29 MED ORDER — LACTATED RINGERS IV SOLN: 500.0000 mL | INTRAVENOUS | Status: DC | PRN

## 2015-10-29 MED ORDER — HYDRALAZINE HCL 20 MG/ML IJ SOLN
5.0000 mg | INTRAMUSCULAR | Status: DC | PRN
  Filled 2015-10-29: qty 1

## 2015-10-29 MED ORDER — EPHEDRINE 5 MG/ML INJ: 10.0000 mg | INTRAVENOUS | Status: DC | PRN

## 2015-10-29 MED ORDER — LIDOCAINE HCL (PF) 1 % IJ SOLN: 30.0000 mL | INTRAMUSCULAR | Status: DC | PRN

## 2015-10-29 MED ORDER — OXYTOCIN 40 UNITS IN LACTATED RINGERS INFUSION - SIMPLE MED
2.5000 [IU]/h | INTRAVENOUS | Status: DC
  Administered 2015-10-30: 2.5 [IU]/h via INTRAVENOUS
  Filled 2015-10-29: qty 1000

## 2015-10-29 MED ORDER — MAGNESIUM SULFATE 50 % IJ SOLN
2.0000 g/h | INTRAVENOUS | Status: DC
  Filled 2015-10-29: qty 80

## 2015-10-29 MED ORDER — OXYTOCIN BOLUS FROM INFUSION: 500.0000 mL | Freq: Once | INTRAVENOUS | Status: DC

## 2015-10-29 MED ORDER — HYDRALAZINE HCL 20 MG/ML IJ SOLN
10.0000 mg | Freq: Once | INTRAMUSCULAR | Status: AC
  Administered 2015-10-29: 10 mg via INTRAVENOUS

## 2015-10-29 MED ORDER — LIDOCAINE HCL (PF) 1 % IJ SOLN
INTRAMUSCULAR | Status: DC | PRN
  Administered 2015-10-29: 4 mL via EPIDURAL
  Administered 2015-10-29: 6 mL via EPIDURAL

## 2015-10-29 MED ORDER — PHENYLEPHRINE 40 MCG/ML (10ML) SYRINGE FOR IV PUSH (FOR BLOOD PRESSURE SUPPORT)
80.0000 ug | PREFILLED_SYRINGE | INTRAVENOUS | Status: DC | PRN
  Filled 2015-10-29: qty 10

## 2015-10-29 MED ORDER — HYDRALAZINE HCL 20 MG/ML IJ SOLN
INTRAMUSCULAR | Status: AC
  Administered 2015-10-29: 20 mg
  Filled 2015-10-29: qty 1

## 2015-10-29 MED ORDER — LACTATED RINGERS IV SOLN
500.0000 mL | Freq: Once | INTRAVENOUS | Status: AC
  Administered 2015-10-29: 500 mL via INTRAVENOUS

## 2015-10-29 MED ORDER — LABETALOL HCL 5 MG/ML IV SOLN: 20.0000 mg | INTRAVENOUS | Status: DC | PRN

## 2015-10-29 MED ORDER — OXYCODONE-ACETAMINOPHEN 5-325 MG PO TABS
2.0000 | ORAL_TABLET | ORAL | Status: DC | PRN
  Administered 2015-10-30: 2 via ORAL
  Filled 2015-10-29: qty 2

## 2015-10-29 MED ORDER — MISOPROSTOL 200 MCG PO TABS: 400.0000 ug | ORAL_TABLET | ORAL | Status: DC

## 2015-10-29 MED ORDER — MISOPROSTOL 200 MCG PO TABS
600.0000 ug | ORAL_TABLET | ORAL | Status: DC
  Administered 2015-10-29: 600 ug via ORAL
  Filled 2015-10-29: qty 3

## 2015-10-29 MED ORDER — OXYCODONE-ACETAMINOPHEN 5-325 MG PO TABS: 1.0000 | ORAL_TABLET | ORAL | Status: DC | PRN

## 2015-10-29 MED ORDER — LACTATED RINGERS IV SOLN
INTRAVENOUS | Status: DC
  Administered 2015-10-29: 17:00:00 via INTRAVENOUS

## 2015-10-29 MED ORDER — LACTATED RINGERS IV SOLN: 500.0000 mL | Freq: Once | INTRAVENOUS | Status: DC

## 2015-10-29 MED ORDER — ACETAMINOPHEN 325 MG PO TABS
650.0000 mg | ORAL_TABLET | ORAL | Status: DC | PRN
  Administered 2015-10-29: 650 mg via ORAL
  Filled 2015-10-29: qty 2

## 2015-10-29 MED ORDER — PHENYLEPHRINE 40 MCG/ML (10ML) SYRINGE FOR IV PUSH (FOR BLOOD PRESSURE SUPPORT): 80.0000 ug | PREFILLED_SYRINGE | INTRAVENOUS | Status: DC | PRN

## 2015-10-29 MED ORDER — ONDANSETRON HCL 4 MG/2ML IJ SOLN
4.0000 mg | Freq: Four times a day (QID) | INTRAMUSCULAR | Status: DC | PRN
  Administered 2015-10-29: 4 mg via INTRAVENOUS
  Filled 2015-10-29: qty 2

## 2015-10-29 MED ORDER — MISOPROSTOL 200 MCG PO TABS
800.0000 ug | ORAL_TABLET | ORAL | Status: DC
  Administered 2015-10-29: 800 ug via VAGINAL
  Filled 2015-10-29 (×2): qty 4

## 2015-10-29 MED ORDER — SOD CITRATE-CITRIC ACID 500-334 MG/5ML PO SOLN: 30.0000 mL | ORAL | Status: DC | PRN

## 2015-10-29 MED ORDER — FENTANYL 2.5 MCG/ML BUPIVACAINE 1/10 % EPIDURAL INFUSION (WH - ANES)
14.0000 mL/h | INTRAMUSCULAR | Status: DC | PRN
  Administered 2015-10-29: 14 mL/h via EPIDURAL
  Filled 2015-10-29: qty 125

## 2015-10-29 MED ORDER — FLEET ENEMA 7-19 GM/118ML RE ENEM: 1.0000 | ENEMA | RECTAL | Status: DC | PRN

## 2015-10-29 NOTE — Anesthesia Procedure Notes (Signed)
Epidural Patient location during procedure: OB Start time: 10/29/2015 10:13 PM End time: 10/29/2015 10:17 PM  Staffing Anesthesiologist: Nilda Simmer Performed: anesthesiologist   Preanesthetic Checklist Completed: patient identified, surgical consent, pre-op evaluation, timeout performed, IV checked, risks and benefits discussed and monitors and equipment checked  Epidural Patient position: sitting Prep: site prepped and draped and DuraPrep Patient monitoring: continuous pulse ox and blood pressure Approach: midline Location: L2-L3 Injection technique: LOR air  Needle:  Needle type: Tuohy  Needle gauge: 17 G Needle length: 9 cm and 9 Needle insertion depth: 7 cm Catheter type: closed end flexible Catheter size: 19 Gauge Catheter at skin depth: 12 cm Test dose: negative  Assessment Events: blood not aspirated, injection not painful, no injection resistance, negative IV test and no paresthesia  Additional Notes Epidural placed easily on first attempt.Reason for block:procedure for pain

## 2015-10-29 NOTE — Anesthesia Preprocedure Evaluation (Signed)
Anesthesia Evaluation  Patient identified by MRN, date of birth, ID band Patient awake    Reviewed: Allergy & Precautions, NPO status , Patient's Chart, lab work & pertinent test results, reviewed documented beta blocker date and time   History of Anesthesia Complications Negative for: history of anesthetic complications  Airway Mallampati: III  TM Distance: >3 FB Neck ROM: Full    Dental  (+) Teeth Intact   Pulmonary neg pulmonary ROS,    Pulmonary exam normal breath sounds clear to auscultation       Cardiovascular hypertension (chronic), Pt. on home beta blockers and Pt. on medications  Rhythm:Regular Rate:Normal     Neuro/Psych PSYCHIATRIC DISORDERS Anxiety Depression negative neurological ROS     GI/Hepatic   Endo/Other  Morbid obesity  Renal/GU      Musculoskeletal   Abdominal (+) + obese,   Peds  Hematology  (+) Blood dyscrasia, anemia ,   Anesthesia Other Findings   Reproductive/Obstetrics (+) Pregnancy (IUFD)                             Anesthesia Physical Anesthesia Plan  ASA: III  Anesthesia Plan: Epidural   Post-op Pain Management:    Induction:   Airway Management Planned: Natural Airway  Additional Equipment:   Intra-op Plan:   Post-operative Plan:   Informed Consent: I have reviewed the patients History and Physical, chart, labs and discussed the procedure including the risks, benefits and alternatives for the proposed anesthesia with the patient or authorized representative who has indicated his/her understanding and acceptance.     Plan Discussed with:   Anesthesia Plan Comments: (I have discussed risks of neuraxial anesthesia including but not limited to infection, bleeding, nerve injury, back pain, headache, seizures, and failure of block. Patient denies bleeding disorders and is not currently anticoagulated. Labs have been reviewed. Risks and benefits  discussed. All patient's questions answered.   Platelets 257)        Anesthesia Quick Evaluation

## 2015-10-29 NOTE — H&P (Signed)
LABOR AND DELIVERY ADMISSION HISTORY AND PHYSICAL NOTE  Donna Vaughan is a 35 y.o. female [phone] with IUP at [redacted]w[redacted]d by 17 and 3 weeks ultrasound presenting for IUFD and elevated blood pressures. Pt does have a history of cHTN and is currently on 400mg  of labetolol BID. She denies headache, blurred vision or right upper quadrant pain at this time.   She reports positive fetal movement. She denies leakage of fluid or vaginal bleeding.  Prenatal History/Complications:  Past Medical History: Past Medical History:  Diagnosis Date  . Anemia   . Anxiety   . Depression   . Fibroids   . Hypertension   . Morbid obesity ([place])   . Pregnant   . Vitamin D insufficiency     Past Surgical History: Past Surgical History:  Procedure Laterality Date  . ADENOIDECTOMY    . CHOLECYSTECTOMY    . FOOT SURGERY    . TONSILLECTOMY      Obstetrical History: OB History    Gravida Para Term Preterm AB Living   5 3 3   1 3    SAB TAB Ectopic Multiple Live Births   1       3      Social History: Social History   Social History  . Marital status: Single    Spouse name: N/A  . Number of children: N/A  . Years of education: N/A   Social History Main Topics  . Smoking status: Never Smoker  . Smokeless tobacco: Never Used  . Alcohol use No  . Drug use: No  . Sexual activity: Yes    Birth control/ protection: None   Other Topics Concern  . None   Social History Narrative  . None    Family History: Family History  Problem Relation Age of Onset  . Hypertension Brother   . Asthma Son   . Diabetes Maternal Uncle   . Stroke Maternal Grandfather   . Hypertension Paternal Grandmother   . Heart disease Paternal Grandmother     CHF  . COPD Son     bronchitis    Allergies: No Known Allergies  Prescriptions Prior to Admission  Medication Sig Dispense Refill Last Dose  . amoxicillin (AMOXIL) 250 MG capsule Take 250 mg by mouth 3 (three) times daily.   Not Taking  . aspirin EC 81 MG  tablet Take 81 mg by mouth daily.   Taking  . labetalol (NORMODYNE) 200 MG tablet Take 2 tablets (400 mg total) by mouth 2 (two) times daily. 120 tablet 3 Taking  . Prenatal Vit-Fe Fumarate-FA (PRENATAL VITAMIN PO) Take by mouth.   Taking  . promethazine (PHENERGAN) 25 MG tablet Take 0.5-1 tablets (12.5-25 mg total) by mouth every 6 (six) hours as needed for nausea or vomiting. (Patient not taking: Reported on 10/29/2015) 30 tablet 0 Not Taking     Review of Systems   All systems reviewed and negative except as stated in HPI  Height 5\' 5"  (1.651 m), weight 281 lb (127.5 kg), last menstrual period 05/20/2015, not currently breastfeeding. General appearance: alert, cooperative and appears stated age Lungs: no respiratory distress Heart: regular rate and rhythm Abdomen: soft, non-tender; bowel sounds normal Extremities: No calf swelling or tenderness      Prenatal labs: ABO, Rh: O/Positive/-- (08/16 1550) Antibody: Negative (08/16 1550)  RPR: Non Reactive (08/16 1550)  HBsAg: Negative (08/16 1550)  HIV: Non Reactive (08/16 1550)     Results for orders placed or performed during the hospital encounter of  10/29/15 (from the past 24 hour(s))  CBC   Collection Time: 10/29/15  4:30 PM  Result Value Ref Range   WBC 10.8 (H) 4.0 - 10.5 K/uL   RBC 4.14 3.87 - 5.11 MIL/uL   Hemoglobin 12.5 12.0 - 15.0 g/dL   HCT 35.4 (L) 36.0 - 46.0 %   MCV 85.5 78.0 - 100.0 fL   MCH 30.2 26.0 - 34.0 pg   MCHC 35.3 30.0 - 36.0 g/dL   RDW 13.9 11.5 - 15.5 %   Platelets 257 150 - 400 K/uL    Patient Active Problem List   Diagnosis Date Noted  . IUFD at 84 weeks or more of gestation 10/29/2015  . IUGR, antenatal 10/21/2015  . Supervision of high risk pregnancy, antepartum 10/11/2015  . IUD failure, pregnant 09/15/2015  . Abnormal chromosomal and genetic finding on antenatal screening mother 09/02/2015  . Chronic hypertension during pregnancy, antepartum 08/28/2015  . Supervision of other  high-risk pregnancy 08/28/2015  . Fibroid uterus 08/04/2014  . OBESITY 05/30/2007  . HYPERTENSION 05/30/2007    Assessment: Donna Vaughan is a 35 y.o. [id number] at [redacted]w[redacted]d here for IUFD at 18 wks and pre-eclampsia with severe features  #Labor:cytotec 600mg  loading followed by 400mg  every 4 hours #Pain: Epidural at antime #FWB: IUFD #pre-eclamapsia with severe features labs pending, start hydralazine protocol. Will start magnesium 4g bolus and 2g per hour.  #IUD in place: unclear location of IUD at this time. Plan for flat plate of abdomen after delivery.  Donna Vaughan 10/29/2015, 4:59 PM

## 2015-10-29 NOTE — Progress Notes (Signed)
I received a referral from [facility] staff who discovered that baby had no heart beat. Donna Vaughan was very much still processing the information and was trying to reach family and let them know.  She was trying to work out transportation issues with them on the phone so that they could come and be with her.  FOB, Donna Vaughan, works in [place] as does pt's mother.  Pt has 3 older children, ages 77, 28 and 2 and this baby, Donna Vaughan, would be have been her fourth son.    We talked about some of the logistics of how the day will proceed and I offered initial grief support and grief education.  We will continue to follow up with her throughout her delivery, but please also page as needs arise.  [place], [place] Pager, [phone] 4:56 PM    10/29/15 1600  Clinical Encounter Type  Visited With Patient  Visit Type Initial;Spiritual support  Referral From Nurse  Spiritual Encounters  Spiritual Needs Grief support  Stress Factors  Patient Stress Factors Loss

## 2015-10-29 NOTE — Anesthesia Pain Management Evaluation Note (Signed)
  CRNA Pain Management Visit Note  Patient: Donna Vaughan, 35 y.o., female  "Hello I am a member of the anesthesia team at [facility]. We have an anesthesia team available at all times to provide care throughout the hospital, including epidural management and anesthesia for C-section. I don't know your plan for the delivery whether it a natural birth, water birth, IV sedation, nitrous supplementation, doula or epidural, but we want to meet your pain goals."   1.Was your pain managed to your expectations on prior hospitalizations?   Yes   2.What is your expectation for pain management during this hospitalization?     Epidural  3.How can we help you reach that goal?Epidural when desired.  Record the patient's initial score and the patient's pain goal.   Pain: 1  Pain Goal: 4 [facility] wants you to be able to say your pain was always managed very well.  Donna Vaughan 10/29/2015

## 2015-10-30 ENCOUNTER — Encounter (HOSPITAL_COMMUNITY): Payer: Self-pay

## 2015-10-30 ENCOUNTER — Inpatient Hospital Stay (HOSPITAL_COMMUNITY): Payer: Medicaid Other

## 2015-10-30 ENCOUNTER — Encounter: Payer: Medicaid Other | Admitting: Obstetrics and Gynecology

## 2015-10-30 LAB — CBC
HCT: 33.5 % — ABNORMAL LOW (ref 36.0–46.0)
HEMATOCRIT: 33.2 % — AB (ref 36.0–46.0)
Hemoglobin: 11.7 g/dL — ABNORMAL LOW (ref 12.0–15.0)
Hemoglobin: 11.4 g/dL — ABNORMAL LOW (ref 12.0–15.0)
MCH: 29.8 pg (ref 26.0–34.0)
MCH: 29.5 pg (ref 26.0–34.0)
MCHC: 34.9 g/dL (ref 30.0–36.0)
MCHC: 34.3 g/dL (ref 30.0–36.0)
MCV: 85.2 fL (ref 78.0–100.0)
MCV: 85.8 fL (ref 78.0–100.0)
PLATELETS: 246 10*3/uL (ref 150–400)
PLATELETS: 237 10*3/uL (ref 150–400)
RBC: 3.93 MIL/uL (ref 3.87–5.11)
RBC: 3.87 MIL/uL (ref 3.87–5.11)
RDW: 14.2 % (ref 11.5–15.5)
RDW: 14.3 % (ref 11.5–15.5)
WBC: 12.3 10*3/uL — ABNORMAL HIGH (ref 4.0–10.5)
WBC: 11.3 10*3/uL — AB (ref 4.0–10.5)

## 2015-10-30 LAB — COMPREHENSIVE METABOLIC PANEL
ALBUMIN: 3.1 g/dL — AB (ref 3.5–5.0)
ALT: 14 U/L (ref 14–54)
AST: 18 U/L (ref 15–41)
Alkaline Phosphatase: 45 U/L (ref 38–126)
Anion gap: 9 (ref 5–15)
BILIRUBIN TOTAL: 0.6 mg/dL (ref 0.3–1.2)
BUN: 6 mg/dL (ref 6–20)
CHLORIDE: 106 mmol/L (ref 101–111)
CO2: 19 mmol/L — AB (ref 22–32)
Calcium: 7.9 mg/dL — ABNORMAL LOW (ref 8.9–10.3)
Creatinine, Ser: 0.74 mg/dL (ref 0.44–1.00)
GFR calc Af Amer: >60 mL/min (ref >60)
GFR calc non Af Amer: >60 mL/min (ref >60)
GLUCOSE: 124 mg/dL — AB (ref 65–99)
POTASSIUM: 3.7 mmol/L (ref 3.5–5.1)
SODIUM: 134 mmol/L — AB (ref 135–145)
Total Protein: 6.9 g/dL (ref 6.5–8.1)

## 2015-10-30 LAB — RPR: RPR Ser Ql: NONREACTIVE

## 2015-10-30 MED ORDER — ACETAMINOPHEN 325 MG PO TABS
650.0000 mg | ORAL_TABLET | ORAL | Status: DC | PRN
  Administered 2015-10-30 – 2015-10-31 (×3): 650 mg via ORAL
  Filled 2015-10-30 (×3): qty 2

## 2015-10-30 MED ORDER — ONDANSETRON HCL 4 MG/2ML IJ SOLN
4.0000 mg | INTRAMUSCULAR | Status: DC | PRN
  Administered 2015-10-31: 4 mg via INTRAVENOUS
  Filled 2015-10-30: qty 2

## 2015-10-30 MED ORDER — BENZOCAINE-MENTHOL 20-0.5 % EX AERO
1.0000 "application " | INHALATION_SPRAY | CUTANEOUS | Status: DC | PRN
  Filled 2015-10-30: qty 56

## 2015-10-30 MED ORDER — BUTALBITAL-APAP-CAFFEINE 50-325-40 MG PO TABS
2.0000 | ORAL_TABLET | Freq: Once | ORAL | Status: AC
  Administered 2015-10-30: 2 via ORAL
  Filled 2015-10-30: qty 2

## 2015-10-30 MED ORDER — DIPHENHYDRAMINE HCL 25 MG PO CAPS: 25.0000 mg | ORAL_CAPSULE | Freq: Four times a day (QID) | ORAL | Status: DC | PRN

## 2015-10-30 MED ORDER — DIBUCAINE 1 % RE OINT
1.0000 "application " | TOPICAL_OINTMENT | RECTAL | Status: DC | PRN
  Filled 2015-10-30: qty 28.4

## 2015-10-30 MED ORDER — TETANUS-DIPHTH-ACELL PERTUSSIS 5-2.5-18.5 LF-MCG/0.5 IM SUSP: 0.5000 mL | Freq: Once | INTRAMUSCULAR | Status: DC

## 2015-10-30 MED ORDER — BUTORPHANOL TARTRATE 1 MG/ML IJ SOLN
2.0000 mg | Freq: Once | INTRAMUSCULAR | Status: AC
  Administered 2015-10-30: 2 mg via INTRAVENOUS
  Filled 2015-10-30: qty 2

## 2015-10-30 MED ORDER — MAGNESIUM SULFATE 50 % IJ SOLN
2.0000 g/h | INTRAVENOUS | Status: AC
  Administered 2015-10-30: 2 g/h via INTRAVENOUS
  Filled 2015-10-30 (×2): qty 80

## 2015-10-30 MED ORDER — AMLODIPINE BESYLATE 10 MG PO TABS
10.0000 mg | ORAL_TABLET | Freq: Every day | ORAL | Status: DC
  Administered 2015-10-30 – 2015-10-31 (×2): 10 mg via ORAL
  Filled 2015-10-30 (×3): qty 1

## 2015-10-30 MED ORDER — PRENATAL MULTIVITAMIN CH
1.0000 | ORAL_TABLET | Freq: Every day | ORAL | Status: DC
  Administered 2015-10-30: 1 via ORAL
  Filled 2015-10-30: qty 1

## 2015-10-30 MED ORDER — COCONUT OIL OIL
1.0000 "application " | TOPICAL_OIL | Status: DC | PRN
  Filled 2015-10-30: qty 120

## 2015-10-30 MED ORDER — WITCH HAZEL-GLYCERIN EX PADS: 1.0000 "application " | MEDICATED_PAD | CUTANEOUS | Status: DC | PRN

## 2015-10-30 MED ORDER — SIMETHICONE 80 MG PO CHEW: 80.0000 mg | CHEWABLE_TABLET | ORAL | Status: DC | PRN

## 2015-10-30 MED ORDER — LACTATED RINGERS IV SOLN
INTRAVENOUS | Status: DC
  Administered 2015-10-30: 16:00:00 via INTRAVENOUS

## 2015-10-30 MED ORDER — SENNOSIDES-DOCUSATE SODIUM 8.6-50 MG PO TABS: 2.0000 | ORAL_TABLET | ORAL | Status: DC

## 2015-10-30 MED ORDER — ONDANSETRON HCL 4 MG PO TABS: 4.0000 mg | ORAL_TABLET | ORAL | Status: DC | PRN

## 2015-10-30 MED ORDER — OXYCODONE HCL 5 MG PO TABS
5.0000 mg | ORAL_TABLET | ORAL | Status: DC | PRN
  Administered 2015-10-30 – 2015-10-31 (×2): 5 mg via ORAL
  Filled 2015-10-30 (×2): qty 1

## 2015-10-30 MED ORDER — ZOLPIDEM TARTRATE 5 MG PO TABS: 5.0000 mg | ORAL_TABLET | Freq: Every evening | ORAL | Status: DC | PRN

## 2015-10-30 NOTE — Progress Notes (Signed)
Called to room after spontaneous vaginal delivery of complete breech IUFD in call.  Nursing reports delivered in call with minimal bearing down.  Fetus with tight nuchal cord.  No significant vaginal bleeding after delivery.  No vaginal lacerations.  Continue magnesium for 24 hours. Recheck PIH labs in AM.

## 2015-10-30 NOTE — Progress Notes (Signed)
CSW received consult for IUFD.  CSW notified Chaplain/K. Claussen and asked that she contact CSW if additional concerns arise warranting CSW involvement.  CSW screening out referral at this time.  CSW remains available for support/assistance as needed.

## 2015-10-30 NOTE — Anesthesia Postprocedure Evaluation (Signed)
Anesthesia Post Note  Patient: Donna Vaughan  Procedure(s) Performed: * No procedures listed *  Patient location during evaluation: [facility] Anesthesia Type: Epidural Level of consciousness: awake and alert Pain management: satisfactory to patient Vital Signs Assessment: post-procedure vital signs reviewed and stable Respiratory status: respiratory function stable Cardiovascular status: stable Postop Assessment: no backache, epidural receding, patient able to bend at knees, no signs of nausea or vomiting and adequate PO intake Anesthetic complications: no Comments: The patient is complaining of a frontal headache that has lasted 3 hours now.  She describes it as a 8/10 on the pain scale ane os not relieved by oxycodone. . I don't think  this is related to the epidural. And will inform [facility] of her status. .     Last Vitals:  Vitals:   10/30/15 1724 10/30/15 1839  BP: (!) 141/86   Pulse: 100   Resp: 20 18  Temp: 36.9 C     Last Pain:  Vitals:   10/30/15 1833  TempSrc:   PainSc: 8    Pain Goal:                 [name]

## 2015-10-30 NOTE — Progress Notes (Signed)
Pt feeling "woozy" after norvasc po... Blood pressure consistent with history.

## 2015-10-30 NOTE — Progress Notes (Signed)
I followed up with pt today.  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  I provided her with info on [employer] and [employer] to help support her older children.  She is feeling the physical effects of the magnesium and our conversations were brief today.  She is aware of our availability for follow up support.  [place], [place] Pager, [phone] 4:24 PM    10/30/15 1600  Clinical Encounter Type  Visited With Patient  Visit Type Follow-up  Referral From Nurse  Spiritual Encounters  Spiritual Needs Emotional;Grief support

## 2015-10-30 NOTE — Progress Notes (Signed)
Dr. Elonda Husky notified of pt c/o headache unrelieved by tylenol and oxycodone.  Orders received.

## 2015-10-31 ENCOUNTER — Other Ambulatory Visit: Payer: Self-pay | Admitting: Obstetrics & Gynecology

## 2015-10-31 ENCOUNTER — Encounter (HOSPITAL_COMMUNITY): Payer: Self-pay | Admitting: *Deleted

## 2015-10-31 MED ORDER — HYDROCHLOROTHIAZIDE 12.5 MG PO CAPS: 12.5000 mg | ORAL_CAPSULE | Freq: Every day | ORAL | 1 refills | Status: DC

## 2015-10-31 MED ORDER — AMLODIPINE BESYLATE 10 MG PO TABS: 10.0000 mg | ORAL_TABLET | Freq: Every day | ORAL | 1 refills | Status: DC

## 2015-10-31 NOTE — Discharge Instructions (Signed)

## 2015-10-31 NOTE — Discharge Summary (Signed)
Physician Discharge Summary  Patient ID: Donna Vaughan MRN: [id number] DOB/AGE: Mar 15, 1980 35 y.o.  Admit date: 10/29/2015 Discharge date: 10/31/2015  Admission Diagnoses: IUFD 24 weeks Chronic Hypertension  IUFD  Discharge Diagnoses:  Principal Problem:   IUFD at 48 weeks or more of gestation Active Problems:   Chronic hypertension during pregnancy, antepartum   Discharged Condition: stable  Hospital Course: diagnosed with fetal demise, underwent ripening and induction with subsequent vaginal delivery with magnesium prophylaxis.  Postpartum course unremarkable  Consults: None  Significant Diagnostic Studies: labs: sonogram  Treatments: magnesium  Discharge Exam: Blood pressure 139/77, pulse 86, temperature 99 F (37.2 C), temperature source Oral, resp. rate 18, height 5\' 5"  (1.651 m), weight 279 lb (126.6 kg), last menstrual period 05/20/2015, SpO2 98 %, not currently breastfeeding. General appearance: alert, cooperative and no distress GI: soft, non-tender; bowel sounds normal; no masses,  no organomegaly  Disposition:   Discharge Instructions    Activity as tolerated    Complete by:  As directed    Call MD for:  persistant nausea and vomiting    Complete by:  As directed    Call MD for:  severe uncontrolled pain    Complete by:  As directed    Call MD for:  temperature >100.4    Complete by:  As directed    Diet - low sodium heart healthy    Complete by:  As directed    Sexual acrtivity    Complete by:  As directed    No sex       Medication List    STOP taking these medications   labetalol 200 MG tablet Commonly known as:  NORMODYNE     TAKE these medications   amLODipine 10 MG tablet Commonly known as:  NORVASC Take 1 tablet (10 mg total) by mouth daily.   aspirin EC 81 MG tablet Take 81 mg by mouth daily.   hydrochlorothiazide 12.5 MG capsule Commonly known as:  MICROZIDE Take 1 capsule (12.5 mg total) by mouth daily.   PRENATAL  VITAMIN PO Take 1 tablet by mouth every morning.      Follow-up Information    Jonnie Kind, MD Follow up on 11/05/2015.   Specialties:  Obstetrics and Gynecology, Radiology Why:  has appointment already Contact information: [place] [address] [phone]           Signed: Florian Buff 10/31/2015, 8:03 AM

## 2015-11-01 ENCOUNTER — Telehealth: Payer: Self-pay | Admitting: Obstetrics and Gynecology

## 2015-11-01 MED ORDER — BUTALBITAL-APAP-CAFF-COD 50-325-40-30 MG PO CAPS: 1.0000 | ORAL_CAPSULE | ORAL | 0 refills | Status: DC | PRN

## 2015-11-01 NOTE — Telephone Encounter (Signed)
Pt c/o headaches since discharged from the hospital, Tylenol not helping. Please advise.

## 2015-11-01 NOTE — Telephone Encounter (Signed)
Pt to come and pick up Rx for Fioricet.

## 2015-11-05 ENCOUNTER — Ambulatory Visit: Payer: Medicaid Other | Admitting: Advanced Practice Midwife

## 2015-11-05 ENCOUNTER — Encounter: Payer: Self-pay | Admitting: Advanced Practice Midwife

## 2015-11-05 VITALS — BP 160/110 | HR 94 | Wt 266.0 lb

## 2015-11-05 DIAGNOSIS — O364XX0 Maternal care for intrauterine death, not applicable or unspecified: Secondary | ICD-10-CM

## 2015-11-05 DIAGNOSIS — O1412 Severe pre-eclampsia, second trimester: Secondary | ICD-10-CM

## 2015-11-05 MED ORDER — HYDROCHLOROTHIAZIDE 25 MG PO TABS: 25.0000 mg | ORAL_TABLET | Freq: Every day | ORAL | 3 refills | Status: DC

## 2015-11-05 NOTE — Progress Notes (Signed)
[place] [facility] Visit  Patient name: GENESES JAIN MRN [id number]  Date of birth: 04/20/1980  CC & HPI:  Donna Vaughan is a 35 y.o.  female presenting today for Blood pressure check.  She has CHTN (was not on meds pre pregnancy), and had a SVD 1 week ago of a 23 week IUFD (tight nuchal, IUGR).  Had preeclampsia w/severe features (BP) and received MgSO4.  She was dc'd home on Norvasc 10mg  and HCTZ 12.5 mg.  She did take her meds this am. Has had a HA since before delivery.  Got rx for Fioricet but hasn't picked up yet   Pertinent History Reviewed:  Medical & Surgical Hx:   Past Medical History:  Diagnosis Date  . Anemia   . Anxiety   . Depression   . Fibroids   . Hypertension   . Morbid obesity ([place])   . Pregnant   . Vitamin D insufficiency    Past Surgical History:  Procedure Laterality Date  . ADENOIDECTOMY    . CHOLECYSTECTOMY    . FOOT SURGERY    . TONSILLECTOMY     Family History  Problem Relation Age of Onset  . Hypertension Brother   . Asthma Son   . Diabetes Maternal Uncle   . Stroke Maternal Grandfather   . Hypertension Paternal Grandmother   . Heart disease Paternal Grandmother     CHF  . COPD Son     bronchitis    Current Outpatient Prescriptions:  .  amLODipine (NORVASC) 10 MG tablet, TAKE 1 TABLET(10 MG) BY MOUTH DAILY, Disp: 90 tablet, Rfl: 1 .  hydrochlorothiazide (MICROZIDE) 12.5 MG capsule, Take 1 capsule (12.5 mg total) by mouth daily., Disp: 30 capsule, Rfl: 1 .  butalbital-acetaminophen-caffeine (FIORICET/CODEINE) 50-325-40-30 MG capsule, Take 1 capsule by mouth every 4 (four) hours as needed for headache. (Patient not taking: Reported on 11/05/2015), Disp: 30 capsule, Rfl: 0 .  hydrochlorothiazide (HYDRODIURIL) 25 MG tablet, Take 1 tablet (25 mg total) by mouth daily., Disp: 30 tablet, Rfl: 3 .  Prenatal Vit-Fe Fumarate-FA (PRENATAL VITAMIN PO), Take 1 tablet by mouth every morning. , Disp: , Rfl:  Social History: Reviewed -  reports that  she has never smoked. She has never used smokeless tobacco.  Review of Systems:   Constitutional: Negative for fever and chills Eyes: Negative for visual disturbances Respiratory: Negative for shortness of breath, dyspnea Cardiovascular: Negative for chest pain or palpitations  Gastrointestinal: Negative for vomiting, diarrhea and constipation; no abdominal pain Genitourinary: Negative for dysuria and urgency, vaginal irritation or itching Musculoskeletal: Negative for back pain, joint pain, myalgias  Neurological: Negative for dizziness and headaches    Objective Findings:    Physical Examination: General appearance - well appearing, and in no distress Mental status - alert, oriented to person, place, and time Chest:  Normal respiratory effort Heart - normal rate and regular rhythm Abdomen:  Soft, nontender Pelvic: deferre Musculoskeletal:  Normal range of motion without pain Extremities:  No edema    No results found for this or any previous visit (from the past 24 hour(s)).    Assessment & Plan:  A:   CHTN, sub optimal control.  Discussed w/LHE.  Will increase HCTZ to 25mg /day P:     Return in about 1 week (around 11/12/2015) for BP check,.  CRESENZO-DISHMAN,Konnor Vondrasek CNM 11/05/2015 12:15 PM

## 2015-11-12 ENCOUNTER — Encounter: Payer: Self-pay | Admitting: Women's Health

## 2015-11-12 ENCOUNTER — Ambulatory Visit (INDEPENDENT_AMBULATORY_CARE_PROVIDER_SITE_OTHER): Payer: Medicaid Other | Admitting: Women's Health

## 2015-11-12 VITALS — BP 122/98 | HR 96 | Wt 265.0 lb

## 2015-11-12 DIAGNOSIS — R51 Headache: Secondary | ICD-10-CM

## 2015-11-12 DIAGNOSIS — N938 Other specified abnormal uterine and vaginal bleeding: Secondary | ICD-10-CM | POA: Diagnosis not present

## 2015-11-12 DIAGNOSIS — I10 Essential (primary) hypertension: Secondary | ICD-10-CM | POA: Diagnosis not present

## 2015-11-12 DIAGNOSIS — F4322 Adjustment disorder with anxiety: Secondary | ICD-10-CM | POA: Diagnosis not present

## 2015-11-12 DIAGNOSIS — O09299 Supervision of pregnancy with other poor reproductive or obstetric history, unspecified trimester: Secondary | ICD-10-CM | POA: Insufficient documentation

## 2015-11-12 HISTORY — DX: Supervision of pregnancy with other poor reproductive or obstetric history, unspecified trimester: O09.299

## 2015-11-12 NOTE — Progress Notes (Signed)
   [facility] Visit  Patient name: Donna Vaughan MRN [id number]  Date of birth: 21-Jul-1980  CC & HPI:  Donna Vaughan is a 35 y.o. [id number] African American female presenting today for bp check. She is 13d s/p SVB of 23wk IUFD (tight nuchal, IUGR). DX w/ pre-e w/ severe features (bp only) on admit and received mag intrapartum and 24hr pp. Labs were normal, more likely worsening CHTN. Has CHTN currently on Norvasc 10mg  daily and was increased from 12.5 to 25mg  HCTZ last week. Took both this am. Has had multiple deaths in family w/in last month, someone passed at beginning of Oct, then this baby 10/18, then grandma 8d later, then husband's dad 4d after that. Hasn't had time to grieve, has been so busy planning funerals, etc. Some headaches, rx for fioricet finally went through the other day- plans to get from pharmacy today.  Had originally planned for BTL, now not so sure, discussing trying to conceive again w/ husband, however she is not ready at this time. Has tried multiple forms of contraception in past, depo made her bleed continuously, IUD evidently fell out and that's how she conceived this child, multiple pills that she didn't like. Plans condoms until she is ready to try for pregnancy again.  Still having sporadic spotting Needs pap Requests work note to work only Mon-Fri, no weekends Patient's last menstrual period was 05/20/2015 (approximate).  Pertinent History Reviewed:  Medical & Surgical Hx:   Past medical, surgical, family, and social history reviewed in electronic medical record Medications: Reviewed & Updated - see associated section Allergies: Reviewed in electronic medical record  Objective Findings:  Vitals: BP (!) 122/98 (BP Location: Right Arm, Patient Position: Sitting, Cuff Size: Large)   Pulse 96   Wt 265 lb (120.2 kg)   LMP 05/20/2015 (Approximate)   BMI 44.10 kg/m  Body mass index is 44.1 kg/m. BP retake 122/100  Physical Examination: General  appearance - alert, well appearing, and in no distress, talks about all recent losses  No results found for this or any previous visit (from the past 24 hour(s)).   Assessment & Plan:  A:   13d s/p SVB of 23wk IUFD  CHTN, better controlled  P:  Continue norvasc 10 and hctz 25 daily  Fioricet for headaches as previously rx'd  No sex until bleeding completely stops  Condoms for contraception  Discussed will need to switch bp meds when she is ready to try for another pregnancy  Work note given to work only Mon-Fri  Take time to grieve all loses  Return in about 2 weeks (around 11/26/2015) for f/u bp w/ provider, then 73mth for pap & physical.  Tawnya Crook CNM, [facility] 11/12/2015 11:32 AM

## 2015-11-26 ENCOUNTER — Ambulatory Visit: Payer: Medicaid Other | Admitting: Advanced Practice Midwife

## 2015-12-03 ENCOUNTER — Ambulatory Visit (INDEPENDENT_AMBULATORY_CARE_PROVIDER_SITE_OTHER): Payer: Medicaid Other | Admitting: Advanced Practice Midwife

## 2015-12-03 ENCOUNTER — Encounter: Payer: Self-pay | Admitting: Advanced Practice Midwife

## 2015-12-03 DIAGNOSIS — I10 Essential (primary) hypertension: Secondary | ICD-10-CM

## 2015-12-03 MED ORDER — TRIAMTERENE-HCTZ 75-50 MG PO TABS: 1.0000 | ORAL_TABLET | Freq: Every day | ORAL | 6 refills | Status: DC

## 2015-12-03 NOTE — Progress Notes (Signed)
Donna Vaughan is a 35 y.o. who presents for a postpartum visit. She is 4 weeks postpartum following a spontaneous vaginal delivery. I have fully reviewed the prenatal and intrapartum course. The delivery was at 23 gestational weeks.  She had an IUFD of an IUGR baby/tight nuchal cord. .  She was diagnosed with preeclampsia.  However, she had CHTN and no abnormal lab values, so this was not likely preeclampsia. However, it was for sure an early placentation problem.   Anesthesia: epidural. Postpartum course has been commplicated by remaining HTN.  .  Bleeding: no bleeding. Bowel function is normal. Bladder function is normal. Patient is not sexually active. Contraception method is condoms. Postpartum depression screening: negative. She had 4 family deaths in October, has finally had time to [place] and is doing "well".  She seems happy, engages with me and her son appropriately. Takes BP at home, and "never are normal" and "a lot are high".  States 140-160/90-110. Did take her meds today.    Current Outpatient Prescriptions:  .  amLODipine (NORVASC) 10 MG tablet, TAKE 1 TABLET(10 MG) BY MOUTH DAILY, Disp: 90 tablet, Rfl: 1 .  butalbital-acetaminophen-caffeine (FIORICET/CODEINE) 50-325-40-30 MG capsule, Take 1 capsule by mouth every 4 (four) hours as needed for headache., Disp: 30 capsule, Rfl: 0 .  hydrochlorothiazide (HYDRODIURIL) 25 MG tablet, Take 1 tablet (25 mg total) by mouth daily., Disp: 30 tablet, Rfl: 3 .  hydrochlorothiazide (MICROZIDE) 12.5 MG capsule, Take 1 capsule (12.5 mg total) by mouth daily. (Patient not taking: Reported on 12/03/2015), Disp: 30 capsule, Rfl: 1 .  Prenatal Vit-Fe Fumarate-FA (PRENATAL VITAMIN PO), Take 1 tablet by mouth every morning. , Disp: , Rfl:   Review of Systems   Constitutional: Negative for fever and chills Eyes: Negative for visual disturbances Respiratory: Negative for shortness of breath, dyspnea Cardiovascular: Negative for chest pain or palpitations   Gastrointestinal: Negative for vomiting, diarrhea and constipation Genitourinary: Negative for dysuria and urgency Musculoskeletal: Negative for back pain, joint pain, myalgias  Neurological: Negative for dizziness Stull having HA  Objective:     Vitals:   12/03/15 1033 12/03/15 1034  BP: (!) 150/102 132/90  Pulse: 84    General:  alert, cooperative and no distress   Breasts:  negative  Lungs: clear to auscultation bilaterally  Heart:  regular rate and rhythm  Abdomen: Soft, nontender   Vulva: Declined exam:  Has pap scheduled in 4 weeks   Vagina:   Cervix:    Corpus:      Rectal Exam: No c/o  hemorrhoids        Assessment:    normal postpartum exam. CHTN, suboptimal control  Plan:   1. Contraception: condoms 2. Follow up in:  A few weeks for pap (already scheduled) 3.  Change BP med to [facility] 75 /50 4/  Go ahead and start ASA 81mg  (idea is to have it preconceptually, but since there is rarely a "preconceptual visit" start it now.  5.  Get antiphosphlipid syndrome panel w/Pap (too early to get it now)  6.  Send me some BPs over mychart next week to see if meds are working

## 2015-12-10 ENCOUNTER — Other Ambulatory Visit: Payer: Medicaid Other | Admitting: Adult Health

## 2015-12-11 ENCOUNTER — Encounter: Payer: Self-pay | Admitting: Obstetrics and Gynecology

## 2015-12-11 ENCOUNTER — Ambulatory Visit (INDEPENDENT_AMBULATORY_CARE_PROVIDER_SITE_OTHER): Payer: Medicaid Other | Admitting: Obstetrics and Gynecology

## 2015-12-11 VITALS — BP 130/98 | HR 86 | Ht 65.0 in | Wt 260.2 lb

## 2015-12-11 DIAGNOSIS — J069 Acute upper respiratory infection, unspecified: Secondary | ICD-10-CM | POA: Diagnosis not present

## 2015-12-11 DIAGNOSIS — I10 Essential (primary) hypertension: Secondary | ICD-10-CM

## 2015-12-11 MED ORDER — RAMIPRIL 5 MG PO CAPS: 5.0000 mg | ORAL_CAPSULE | Freq: Every day | ORAL | 5 refills | Status: DC

## 2015-12-11 NOTE — Progress Notes (Addendum)
Patient ID: GALI RUNION, female   DOB: Nov 07, 1980, 35 y.o.   MRN: [id number]   [facility] Visit  @DATE @            Patient name: Donna Vaughan MRN [id number]  Date of birth: 05/10/1980  CC & HPI:   Chief Complaint  Patient presents with  . productive cough with yellow mucus, nasal congestion     Donna Vaughan is a 35 y.o. female with h/o HTN on Maxzide presenting today for congestion and productive cough with yellow sputum x 1 week. Pt states she took OTC Theraflu and had adverse effects, so she wanted to find out what would be appropriate for her to take with her HTN. She reports BP readings of 140/98 and 160/110 at home.   She also states she would like to proceed with permanent sterilization and endometrial ablation, as discussed during last pregnancy.   ROS:  ROS +nasal congestion  +productive cough with yellow phlegm   Pertinent History Reviewed:   Reviewed. Significant for HTN.  Medical         Past Medical History:  Diagnosis Date  . Anemia   . Anxiety   . Depression   . Fibroids   . Hypertension   . Morbid obesity ([place])   . Pregnant   . Vitamin D insufficiency                               Surgical Hx:    Past Surgical History:  Procedure Laterality Date  . ADENOIDECTOMY    . CHOLECYSTECTOMY    . FOOT SURGERY    . TONSILLECTOMY     Medications: Reviewed & Updated - see associated section                       Current Outpatient Prescriptions:  .  butalbital-acetaminophen-caffeine (FIORICET/CODEINE) 50-325-40-30 MG capsule, Take 1 capsule by mouth every 4 (four) hours as needed for headache., Disp: 30 capsule, Rfl: 0 .  Prenatal Vit-Fe Fumarate-FA (PRENATAL VITAMIN PO), Take 1 tablet by mouth every morning. , Disp: , Rfl:  .  triamterene-hydrochlorothiazide (MAXZIDE) 75-50 MG tablet, Take 1 tablet by mouth daily., Disp: 30 tablet, Rfl: 6   Social History: Reviewed -  reports that she has never smoked. She has never used smokeless  tobacco.  Objective Findings:  Vitals: Blood pressure (!) 130/98, pulse 86, height 5\' 5"  (1.651 m), weight 260 lb 3.2 oz (118 kg), last menstrual period 12/04/2015.  Physical Examination: Discussion only    Assessment & Plan:   A:  1. HTN 2. URI 3. Pt wishes to proceed with endometrial ablation, permanent sterilization, even being s/p FDIU  P:  1. Will start pt on 5 mg Ramipril and continue Triamterene/HCTZ 2. Will rx azithromycin, recommended Robitussin DM  3. F/u in 7-10 days for pre-op exam and recheck BP   By signing my name below, I, Hansel Feinstein, attest that this documentation has been prepared under the direction and in the presence of Jonnie Kind, MD. Electronically Signed: Hansel Feinstein, ED Scribe. 12/11/15. 1:53 PM.  I personally performed the services described in this documentation, which was SCRIBED in my presence. The recorded information has been reviewed and considered accurate. It has been edited as necessary during review. Jonnie Kind, MD

## 2015-12-25 ENCOUNTER — Other Ambulatory Visit: Payer: Self-pay | Admitting: Obstetrics and Gynecology

## 2015-12-25 ENCOUNTER — Ambulatory Visit (INDEPENDENT_AMBULATORY_CARE_PROVIDER_SITE_OTHER): Payer: Medicaid Other | Admitting: Obstetrics and Gynecology

## 2015-12-25 ENCOUNTER — Encounter: Payer: Self-pay | Admitting: Obstetrics and Gynecology

## 2015-12-25 VITALS — BP 122/90 | HR 90 | Wt 264.8 lb

## 2015-12-25 DIAGNOSIS — Z3009 Encounter for other general counseling and advice on contraception: Secondary | ICD-10-CM | POA: Diagnosis not present

## 2015-12-25 DIAGNOSIS — N920 Excessive and frequent menstruation with regular cycle: Secondary | ICD-10-CM

## 2015-12-25 DIAGNOSIS — D251 Intramural leiomyoma of uterus: Secondary | ICD-10-CM

## 2015-12-25 NOTE — Progress Notes (Signed)
Preoperative History and Physical  Donna Vaughan is a 35 y.o. [id number] here for surgical management of sterilization and endometrial ablation.   No significant preoperative concerns. LNMP was 12/04/15.   Proposed surgery: endometrial ablation and bilateral salpingectomy   Past Medical History:  Diagnosis Date  . Anemia   . Anxiety   . Depression   . Fibroids   . Hypertension   . Morbid obesity ([place])   . Pregnant   . Vitamin D insufficiency    Past Surgical History:  Procedure Laterality Date  . ADENOIDECTOMY    . CHOLECYSTECTOMY    . FOOT SURGERY    . TONSILLECTOMY     OB History  Gravida Para Term Preterm AB Living  5 4 3 1 1 3   SAB TAB Ectopic Multiple Live Births  1     0 3    # Outcome Date GA Lbr Len/2nd Weight Sex Delivery Anes PTL Lv  5 Preterm 10/30/15 [redacted]w[redacted]d / 00:02 9.5 oz (0.269 kg) M Vag-Spont EPI  FD  4 Term 12/04/13 [redacted]w[redacted]d  6 lb 11 oz (3.033 kg) M Vag-Spont EPI N LIV  3 SAB 01/2013     SAB   FD  2 Term 11/09/07 [redacted]w[redacted]d  6 lb 12 oz (3.062 kg) M Vag-Spont EPI N LIV  1 Term 02/10/99 [redacted]w[redacted]d  8 lb 3 oz (3.714 kg) M Vag-Spont  N LIV    Patient denies any other pertinent gynecologic issues.   Current Outpatient Prescriptions on File Prior to Visit  Medication Sig Dispense Refill  . butalbital-acetaminophen-caffeine (FIORICET/CODEINE) 50-325-40-30 MG capsule Take 1 capsule by mouth every 4 (four) hours as needed for headache. 30 capsule 0  . ramipril (ALTACE) 5 MG capsule Take 1 capsule (5 mg total) by mouth daily. 30 capsule 5  . triamterene-hydrochlorothiazide (MAXZIDE) 75-50 MG tablet Take 1 tablet by mouth daily. 30 tablet 6  . Prenatal Vit-Fe Fumarate-FA (PRENATAL VITAMIN PO) Take 1 tablet by mouth every morning.      No current facility-administered medications on file prior to visit.    No Known Allergies  Social History:   reports that she has never smoked. She has never used smokeless tobacco. She reports that she does not drink alcohol or use  drugs.  Family History  Problem Relation Age of Onset  . Hypertension Brother   . Asthma Son   . Diabetes Maternal Uncle   . Stroke Maternal Grandfather   . Hypertension Paternal Grandmother   . Heart disease Paternal Grandmother     CHF  . COPD Son     bronchitis    Review of Systems: Noncontributory  PHYSICAL EXAM: Blood pressure 122/90, pulse 90, weight 264 lb 12.8 oz (120.1 kg), last menstrual period 12/04/2015. General appearance - alert, well appearing, and in no distress Chest - clear to auscultation, no wheezes, rales or rhonchi, symmetric air entry Heart - normal rate and regular rhythm Abdomen - soft, nontender, nondistended, no masses or organomegaly                     Pelvic - examination normal appearing clear cervical mucus, well supported, uterus is tiny and mobile Extremities - peripheral pulses normal, no pedal edema, no clubbing or cyanosis  Labs: No results found for this or any previous visit (from the past 336 hour(s)).  Imaging Studies: No results found.  Assessment: Patient Active Problem List   Diagnosis Date Noted  . Prior pregnancy with fetal demise 11/12/2015  .  Fibroid uterus 08/04/2014  . OBESITY 05/30/2007  . Chronic hypertension 05/30/2007    Plan: Patient will undergo surgical management with bilateral salpingectomy and Hysteroscopy dilation and curettage and endometrial ablation.    By signing my name below, I, Sonum Patel, attest that this documentation has been prepared under the direction and in the presence of Jonnie Kind, MD. Electronically Signed: Sonum Patel, Education administrator. 12/25/15. 10:33 AM.  I personally performed the services described in this documentation, which was SCRIBED in my presence. The recorded information has been reviewed and considered accurate. It has been edited as necessary during review. Jonnie Kind, MD

## 2015-12-25 NOTE — Patient Instructions (Signed)
Donna Vaughan  12/25/2015     @PREFPERIOPPHARMACY @   Your procedure is scheduled on  12/31/2015   Report to [facility] at  720  A.M.  Call this number if you have problems the morning of surgery:  [phone]   Remember:  Do not eat food or drink liquids after midnight.  Take these medicines the morning of surgery with A SIP OF WATER  Fioricet, altace, maxzide.   Do not wear jewelry, make-up or nail polish.  Do not wear lotions, powders, or perfumes, or deoderant.  Do not shave 48 hours prior to surgery.  Men may shave face and neck.  Do not bring valuables to the hospital.  [facility] is not responsible for any belongings or valuables.  Contacts, dentures or bridgework may not be worn into surgery.  Leave your suitcase in the car.  After surgery it may be brought to your room.  For patients admitted to the hospital, discharge time will be determined by your treatment team.  Patients discharged the day of surgery will not be allowed to drive home.   Name and phone number of your driver:   family Special instructions:  none  Please read over the following fact sheets that you were given. Anesthesia Post-op Instructions and Care and Recovery After Surgery       What You Need to Know About Female Sterilization Female sterilization is surgery to prevent pregnancy. In this surgery, the fallopian tubes are either blocked or closed off. This prevents eggs from reaching the uterus so that the eggs cannot be fertilized by sperm and you cannot get pregnant. Sterilization is permanent. It should only be done if you are sure that you do not want to be able to have children. What are the sterilization surgery options? There are several kinds of female sterilization surgeries. They include:  Laparoscopic tubal ligation. In this surgery, the fallopian tubes are tied off, sealed with heat, or blocked with a clip, ring, or clamp. A small portion  of each fallopian tube may also be removed. This surgery is done through several small cuts (incisions).  Postpartum tubal ligation. This is also called a mini-laparotomy. This surgery is done right after childbirth or 1 or 2 days after childbirth. In this surgery, the fallopian tubes are tied off, sealed with heat, or blocked with a clip, ring, or clamp. A small portion of each fallopian tube may also be removed. The surgery is done through a single incision.  Hysteroscopic sterilization. In this surgery, a tiny, spring-like coil is inserted through the cervix and uterus into the fallopian tubes. The coil causes scarring, which blocks the tubes. After the surgery, contraception should be used for 3 months to allow the scar tissue to form completely. Is sterilization safe? Generally, sterilization is safe. Complications are rare. However, there are risks. They include:  Bleeding.  Infection.  Reaction to medicine used during the procedure.  Injury to surrounding organs.  Failure of the procedure. How effective is sterilization? Sterilization is nearly 100% effective, but it can fail. Also, the fallopian tubes can grow back together over time. If this happens, you will be able to get pregnant again. Women who have had this procedure have a higher chance of having an ectopic pregnancy. An ectopic pregnancy is a pregnancy that happens outside of the uterus. This kind of pregnancy is unsuccessful and can lead  to serious bleeding if it is not treated. What are the benefits?  It is usually effective for a lifetime.  It is usually safe.  It does not have the drawbacks of other types of birth control: That means:  Your hormones are not affected. Because of this, your menstrual periods, sexual desire, and sexual performance will not be affected.  There are no side effects. What are the drawbacks?  If you change your mind and decide that you want to have children, you may not be able to.  Sterilization may be reversed, but a reversal is not always successful.  It does not provide protection against STDs (sexually transmitted diseases).  It increases the chance of having an ectopic pregnancy. This information is not intended to replace advice given to you by your health care provider. Make sure you discuss any questions you have with your health care provider. Document Released: 06/17/2007 Document Revised: 08/22/2015 Document Reviewed: 09/25/2014 Elsevier Interactive Patient Education  2017 [place], also called tubectomy, is the surgical removal of one of the fallopian tubes. The fallopian tubes are where eggs travel from the ovaries to the uterus. Removing one fallopian tube does not prevent you from becoming pregnant. It also does not cause problems with your menstrual periods. You may need a salpingectomy if you:  Have a fertilized egg that attaches to the fallopian tube (ectopic pregnancy), especially one that causes the tube to burst or tear (rupture).  Have an infected fallopian tube.  Have cancer of the fallopian tube or nearby organs.  Have had an ovary removed due to a cyst or tumor.  Have had your uterus removed. There are three different methods that can be used for a salpingectomy:  Open. This method involves making one large incision in your abdomen.  Laparoscopic. This method involves using a thin, lighted tube with a tiny camera on the end (laparoscope) to help perform the procedure. The laparoscope will allow your surgeon to make several small incisions in the abdomen instead of a large incision.  Robot-assisted: This method involves using a computer to control surgical instruments that are attached to robotic arms. Tell a health care provider about:  Any allergies you have.  All medicines you are taking, including vitamins, herbs, eye drops, creams, and over-the-counter medicines.  Any problems you or family  members have had with anesthetic medicines.  Any blood disorders you have.  Any surgeries you have had.  Any medical conditions you have.  Whether you are pregnant or may be pregnant. What are the risks? Generally, this is a safe procedure. However, problems may occur, including:  Infection.  Bleeding.  Allergic reactions to medicines.  Damage to other structures or organs.  Blood clots in the legs or lungs. What happens before the procedure? Staying hydrated  Follow instructions from your health care provider about hydration, which may include:  Up to 2 hours before the procedure - you may continue to drink clear liquids, such as water, clear fruit juice, black coffee, and plain tea. Eating and drinking restrictions  Follow instructions from your health care provider about eating and drinking, which may include:  8 hours before the procedure - stop eating heavy meals or foods such as meat, fried foods, or fatty foods.  6 hours before the procedure - stop eating light meals or foods, such as toast or cereal.  6 hours before the procedure - stop drinking milk or drinks that contain milk.  2 hours before the procedure -  stop drinking clear liquids. Medicines  Ask your health care provider about:  Changing or stopping your regular medicines. This is especially important if you are taking diabetes medicines or blood thinners.  Taking medicines such as aspirin and ibuprofen. These medicines can thin your blood. Do not take these medicines before your procedure if your health care provider instructs you not to.  You may be given antibiotic medicine to help prevent infection. General instructions  Do not smoke for at least 2 weeks before your procedure. If you need help quitting, ask your health care provider.  You may have an exam or tests, such as an electrocardiogram (ECG).  You may have a blood or urine sample taken.  Ask your health care provider:  Whether you  should stop removing hair from your surgical area.  How your surgical site will be marked or identified.  You may be asked to shower with a germ-killing soap.  Plan to have someone take you home from the hospital or clinic.  If you will be going home right after the procedure, plan to have someone with you for 24 hours. What happens during the procedure?  To reduce your risk of infection:  Your health care team will wash or sanitize their hands.  Hair may be removed from the surgical area.  Your skin will be washed with soap.  An IV tube will be inserted into one of your veins.  You will be given a medicine to make you fall asleep (general anesthetic). You may also be given a medicine to help you relax (sedative).  A thin tube (catheter) may be inserted through your urethra and into your bladder to drain urine during your procedure.  Depending on the type of procedure you are having, one incision or several small incisions will be made in your abdomen.  Your fallopian tube will be cut and removed from where it attaches to your uterus.  Your blood vessels will be clamped and tied to prevent excess bleeding.  The incision(s) in your abdomen will be closed with stitches (sutures), staples, or skin glue.  A bandage (dressing) may be placed over your incision(s). The procedure may vary among health care providers and hospitals. What happens after the procedure?  Your blood pressure, heart rate, breathing rate, and blood oxygen level will be monitored until the medicines you were given have worn off.  You may continue to receive fluids and medicines through an IV tube.  You may continue to have a catheter draining your urine.  You may have to wear compression stockings. These stockings help to prevent blood clots and reduce swelling in your legs.  You will be given pain medicine as needed.  Do not drive for 24 hours if you received a sedative. Summary  Salpingectomy is a  surgical procedure to remove one of the fallopian tubes.  The procedure may be done with an open incision, with a laparoscope, or with computer-controlled instruments.  Depending on the type of procedure you are having, one incision or several small incisions will be made in your abdomen.  Your blood pressure, heart rate, breathing rate, and blood oxygen level will be monitored until the medicines you were given have worn off.  Plan to have someone take you home from the hospital or clinic. This information is not intended to replace advice given to you by your health care provider. Make sure you discuss any questions you have with your health care provider. Document Released: 05/17/2008 Document Revised: 08/16/2015  Document Reviewed: 06/22/2012 Elsevier Interactive Patient Education  2017 [place] After This sheet gives you information about how to care for yourself after your procedure. Your health care provider may also give you more specific instructions. If you have problems or questions, contact your health care provider. What can I expect after the procedure? After your procedure, it is common to have:  Pain in your abdomen.  Some occasional vaginal bleeding (spotting).  Tiredness. Follow these instructions at home: Incision care  Keep your incision area and your bandage (dressing) clean and dry.  Follow instructions from your health care provider about how to take care of your incision. Make sure you:  Wash your hands with soap and water before you change your dressing. If soap and water are not available, use hand sanitizer.  Change your dressing astold by your health care provider.  Leave stitches (sutures), staples, skin glue, or adhesive strips in place. These skin closures may need to stay in place for 2 weeks or longer. If adhesive strip edges start to loosen and curl up, you may trim the loose edges. Do not remove adhesive strips completely  unless your health care provider tells you to do that.  Check your incision area every day for signs of infection. Check for:  More redness, swelling, or pain.  More fluid or blood.  Warmth.  Pus or a bad smell. Activity  Do not drive or use heavy machinery while taking prescription pain medicine.  Do not drive for 24 hours if you received a medicine to help you relax (sedative).  Rest as directed by your health care provider. Ask your health care provider what activities are safe for you. You should avoid:  Lifting anything that is heavier than 10 lb (4.5 kg) until your health care provider approves.  Activities that require a lot of energy.  Until your health care provider approves:  Do not douche.  Do not use tampons.  Do not have sexual intercourse. General instructions  Take over-the-counter and prescription medicines only as told by your health care provider.  To prevent or treat constipation while you are taking prescription pain medicine, your health care provider may recommend that you:  Drink enough fluid to keep your urine clear or pale yellow.  Take over-the-counter or prescription medicines.  Eat foods that are high in fiber, such as fresh fruits and vegetables, whole grains, and beans.  Limit foods that are high in fat and processed sugars, such as fried and sweet foods.  Do not take baths, swim, or use a hot tub until your health care provider approves. You may take showers.  Wear compression stockings as told by your health care provider. These stockings help to prevent blood clots and reduce swelling in your legs.  Keep all follow-up visits as told by your health care provider. This is important. Contact a health care provider if:  You have:  Pain when you urinate.  More redness, swelling, or pain around your incision.  More fluid or blood coming from your incision.  Pus or a bad smell coming from your incision.  A fever.  Abdominal pain  that gets worse or does not get better with medicine.  Your incision feels warm to the touch.  Your incision starts to break open.  You develop a rash.  You develop nausea and vomiting.  You feel light-headed. Get help right away if:  You develop pain in your chest or leg.  You develop shortness of  breath.  You faint.  You have increased vaginal bleeding. This information is not intended to replace advice given to you by your health care provider. Make sure you discuss any questions you have with your health care provider. Document Released: 04/04/2010 Document Revised: 08/28/2015 Document Reviewed: 08/29/2015 Elsevier Interactive Patient Education  2017 [place].  Endometrial Ablation Endometrial ablation removes the lining of the uterus (endometrium). It is usually a same-day, outpatient treatment. Ablation helps avoid major surgery, such as surgery to remove the cervix and uterus (hysterectomy). After endometrial ablation, you will have little or no menstrual bleeding and may not be able to have children. However, if you are premenopausal, you will need to use a reliable method of birth control following the procedure because of the small chance that pregnancy can occur. There are different reasons to have this procedure. These reasons include:  Heavy periods.  Bleeding that is causing anemia.  Irregular bleeding.  Bleeding fibroids on the lining inside the uterus if they are smaller than 3 centimeters. This procedure may not be possible for you if:   You want to have children in the future.   You have severe cramps with your menstrual period.   You have precancerous or cancerous cells in your uterus.   You were recently pregnant.   You have gone through menopause.   You have had major surgery on your uterus, resulting in thinning of the uterine wall. Surgeries may include:  The removal of one or more uterine fibroids (myomectomy).  A cesarean section  with a classic (vertical) incision on your uterus. Ask your health care provider what type of cesarean you had. Sometimes the scar on your skin is different than the scar on your uterus. Even if you have had surgery on your uterus, certain types of ablation may still be safe for you. Talk with your health care provider. LET [facility] CARE PROVIDER KNOW ABOUT:  Any allergies you have.  All medicines you are taking, including vitamins, herbs, eye drops, creams, and over-the-counter medicines.  Previous problems you or members of your family have had with the use of anesthetics.  Any blood disorders you have.  Previous surgeries you have had.  Medical conditions you have. RISKS AND COMPLICATIONS  Generally, this is a safe procedure. However, as with any procedure, complications can occur. Possible complications include:  Perforation of the uterus.  Bleeding.  Infection of the uterus, bladder, or vagina.  Injury to surrounding organs.  An air bubble to the lung (air embolus).  Pregnancy following the procedure.  Failure of the procedure to help the problem, requiring hysterectomy.  Decreased ability to diagnose cancer in the lining of the uterus. BEFORE THE PROCEDURE  The lining of the uterus must be tested to make sure there is no pre-cancerous or cancer cells present.  An ultrasound may be performed to look at the size of the uterus and to check for abnormalities.  Medicines may be given to thin the lining of the uterus. PROCEDURE  During the procedure, your health care provider will use a tool called a resectoscope to help see inside your uterus. There are different ways to remove the lining of your uterus.   Radiofrequency - This method uses a radiofrequency-alternating electric current to remove the lining of the uterus.  Cryotherapy - This method uses extreme cold to freeze the lining of the uterus.  Heated-Free Liquid - This method uses heated salt (saline)  solution to remove the lining of the uterus.  Microwave - This method uses high-energy microwaves to heat up the lining of the uterus to remove it.  Thermal balloon - This method involves inserting a catheter with a balloon tip into the uterus. The balloon tip is filled with heated fluid to remove the lining of the uterus. AFTER THE PROCEDURE  After your procedure, do not have sexual intercourse or insert anything into your vagina until permitted by your health care provider. After the procedure, you may experience:  Cramps.  Vaginal discharge.  Frequent urination. This information is not intended to replace advice given to you by your health care provider. Make sure you discuss any questions you have with your health care provider. Document Released: 11/08/2003 Document Revised: 09/19/2014 Document Reviewed: 06/01/2012 Elsevier Interactive Patient Education  2017 [place]. Hysteroscopy Hysteroscopy is a procedure used for looking inside the womb (uterus). It may be done for various reasons, including:  To evaluate abnormal bleeding, fibroid (benign, noncancerous) tumors, polyps, scar tissue (adhesions), and possibly cancer of the uterus.  To look for lumps (tumors) and other uterine growths.  To look for causes of why a woman cannot get pregnant (infertility), causes of recurrent loss of pregnancy (miscarriages), or a lost intrauterine device (IUD).  To perform a sterilization by blocking the fallopian tubes from inside the uterus. In this procedure, a thin, flexible tube with a tiny light and camera on the end of it (hysteroscope) is used to look inside the uterus. A hysteroscopy should be done right after a menstrual period to be sure you are not pregnant. LET [facility] CARE PROVIDER KNOW ABOUT:   Any allergies you have.  All medicines you are taking, including vitamins, herbs, eye drops, creams, and over-the-counter medicines.  Previous problems you or members of your  family have had with the use of anesthetics.  Any blood disorders you have.  Previous surgeries you have had.  Medical conditions you have. RISKS AND COMPLICATIONS  Generally, this is a safe procedure. However, as with any procedure, complications can occur. Possible complications include:  Putting a hole in the uterus.  Excessive bleeding.  Infection.  Damage to the cervix.  Injury to other organs.  Allergic reaction to medicines.  Too much fluid used in the uterus for the procedure. BEFORE THE PROCEDURE   Ask your health care provider about changing or stopping any regular medicines.  Do not take aspirin or blood thinners for 1 week before the procedure, or as directed by your health care provider. These can cause bleeding.  If you smoke, do not smoke for 2 weeks before the procedure.  In some cases, a medicine is placed in the cervix the day before the procedure. This medicine makes the cervix have a larger opening (dilate). This makes it easier for the instrument to be inserted into the uterus during the procedure.  Do not eat or drink anything for at least 8 hours before the surgery.  Arrange for someone to take you home after the procedure. PROCEDURE   You may be given a medicine to relax you (sedative). You may also be given one of the following:  A medicine that numbs the area around the cervix (local anesthetic).  A medicine that makes you sleep through the procedure (general anesthetic).  The hysteroscope is inserted through the vagina into the uterus. The camera on the hysteroscope sends a picture to a TV screen. This gives the surgeon a good view inside the uterus.  During the procedure, air or a liquid is  put into the uterus, which allows the surgeon to see better.  Sometimes, tissue is gently scraped from inside the uterus. These tissue samples are sent to a lab for testing. AFTER THE PROCEDURE   If you had a general anesthetic, you may be groggy for a  couple hours after the procedure.  If you had a local anesthetic, you will be able to go home as soon as you are stable and feel ready.  You may have some cramping. This normally lasts for a couple days.  You may have bleeding, which varies from light spotting for a few days to menstrual-like bleeding for 3-7 days. This is normal.  If your test results are not back during the visit, make an appointment with your health care provider to find out the results. This information is not intended to replace advice given to you by your health care provider. Make sure you discuss any questions you have with your health care provider. Document Released: 04/06/2000 Document Revised: 10/19/2012 Document Reviewed: 07/28/2012 Elsevier Interactive Patient Education  2017 [place]. Hysteroscopy, Care After Refer to this sheet in the next few weeks. These instructions provide you with information on caring for yourself after your procedure. Your health care provider may also give you more specific instructions. Your treatment has been planned according to current medical practices, but problems sometimes occur. Call your health care provider if you have any problems or questions after your procedure.  WHAT TO EXPECT AFTER THE PROCEDURE After your procedure, it is typical to have the following:  You may have some cramping. This normally lasts for a couple days.  You may have bleeding. This can vary from light spotting for a few days to menstrual-like bleeding for 3-7 days. HOME CARE INSTRUCTIONS  Rest for the first 1-2 days after the procedure.  Only take over-the-counter or prescription medicines as directed by your health care provider. Do not take aspirin. It can increase the chances of bleeding.  Take showers instead of baths for 2 weeks or as directed by your health care provider.  Do not drive for 24 hours or as directed.  Do not drink alcohol while taking pain medicine.  Do not use tampons,  douche, or have sexual intercourse for 2 weeks or until your health care provider says it is okay.  Take your temperature twice a day for 4-5 days. Write it down each time.  Follow your health care provider's advice about diet, exercise, and lifting.  If you develop constipation, you may:  Take a mild laxative if your health care provider approves.  Add bran foods to your diet.  Drink enough fluids to keep your urine clear or pale yellow.  Try to have someone with you or available to you for the first 24-48 hours, especially if you were given a general anesthetic.  Follow up with your health care provider as directed. SEEK MEDICAL CARE IF:  You feel dizzy or lightheaded.  You feel sick to your stomach (nauseous).  You have abnormal vaginal discharge.  You have a rash.  You have pain that is not controlled with medicine. SEEK IMMEDIATE MEDICAL CARE IF:  You have bleeding that is heavier than a normal menstrual period.  You have a fever.  You have increasing cramps or pain, not controlled with medicine.  You have new belly (abdominal) pain.  You pass out.  You have pain in the tops of your shoulders (shoulder strap areas).  You have shortness of breath. This information is  not intended to replace advice given to you by your health care provider. Make sure you discuss any questions you have with your health care provider. Document Released: 10/19/2012 Document Reviewed: 10/19/2012 Elsevier Interactive Patient Education  2017 [place].  Dilation and Curettage or Vacuum Curettage Dilation and curettage (D&C) and vacuum curettage are minor procedures. A D&C involves stretching (dilation) the cervix and scraping (curettage) the inside lining of the uterus (endometrium). During a D&C, tissue is gently scraped from the endometrium, starting from the top portion of the uterus down to the lowest part of the uterus (cervix). During a vacuum curettage, the lining and tissue in  the uterus are removed with the use of gentle suction. Curettage may be performed to either diagnose or treat a problem. As a diagnostic procedure, curettage is performed to examine tissues from the uterus. A diagnostic curettage may be done if you have:  Irregular bleeding in the uterus.  Bleeding with the development of clots.  Spotting between menstrual periods.  Prolonged menstrual periods or other abnormal bleeding.  Bleeding after menopause.  No menstrual period (amenorrhea).  A change in size and shape of the uterus.  Abnormal endometrial cells discovered during a Pap test. As a treatment procedure, curettage may be performed for the following reasons:  Removal of an IUD (intrauterine device).  Removal of retained placenta after giving birth.  Abortion.  Miscarriage.  Removal of endometrial polyps.  Removal of uncommon types of noncancerous lumps (fibroids). Tell a health care provider about:  Any allergies you have, including allergies to prescribed medicine or latex.  All medicines you are taking, including vitamins, herbs, eye drops, creams, and over-the-counter medicines. This is especially important if you take any blood-thinning medicine. Bring a list of all of your medicines to your appointment.  Any problems you or family members have had with anesthetic medicines.  Any blood disorders you have.  Any surgeries you have had.  Your medical history and any medical conditions you have.  Whether you are pregnant or may be pregnant.  Recent vaginal infections you have had.  Recent menstrual periods, bleeding problems you have had, and what form of birth control (contraception) you use. What are the risks? Generally, this is a safe procedure. However, problems may occur, including:  Infection.  Heavy vaginal bleeding.  Allergic reactions to medicines.  Damage to the cervix or other structures or organs.  Development of scar tissue (adhesions)  inside the uterus, which can cause abnormal amounts of menstrual bleeding. This may make it harder to get pregnant in the future.  A hole (perforation) or puncture in the uterine wall. This is rare. What happens before the procedure? Staying hydrated  Follow instructions from your health care provider about hydration, which may include:  Up to 2 hours before the procedure - you may continue to drink clear liquids, such as water, clear fruit juice, black coffee, and plain tea. Eating and drinking restrictions  Follow instructions from your health care provider about eating and drinking, which may include:  8 hours before the procedure - stop eating heavy meals or foods such as meat, fried foods, or fatty foods.  6 hours before the procedure - stop eating light meals or foods, such as toast or cereal.  6 hours before the procedure - stop drinking milk or drinks that contain milk.  2 hours before the procedure - stop drinking clear liquids. If your health care provider told you to take your medicine(s) on the day of your  procedure, take them with only a sip of water. Medicines  Ask your health care provider about:  Changing or stopping your regular medicines. This is especially important if you are taking diabetes medicines or blood thinners.  Taking medicines such as aspirin and ibuprofen. These medicines can thin your blood. Do not take these medicines before your procedure if your health care provider instructs you not to.  You may be given antibiotic medicine to help prevent infection. General instructions  For 24 hours before your procedure, do not:  Douche.  Use tampons.  Use medicines, creams, or suppositories in the vagina.  Have sexual intercourse.  You may be given a pregnancy test on the day of the procedure.  Plan to have someone take you home from the hospital or clinic.  You may have a blood or urine sample taken.  If you will be going home right after the  procedure, plan to have someone with you for 24 hours. What happens during the procedure?  To reduce your risk of infection:  Your health care team will wash or sanitize their hands.  Your skin will be washed with soap.  An IV tube will be inserted into one of your veins.  You will be given one of the following:  A medicine that numbs the area in and around the cervix (local anesthetic).  A medicine to make you fall asleep (general anesthetic).  You will lie down on your back, with your feet in foot rests (stirrups).  The size and position of your uterus will be checked.  A lubricated instrument (speculum or Sims retractor) will be inserted into the back side of your vagina. The speculum will be used to hold apart the walls of your vagina so your health care provider can see your cervix.  A tool (tenaculum) will be attached to the lip of the cervix to stabilize it.  Your cervix will be softened and dilated. This may be done by:  Taking a medicine.  Having tapered dilators or thin rods (laminaria) or gradual widening instruments (tapered dilators) inserted into your cervix.  A small, sharp, curved instrument (curette) will be used to scrape a small amount of tissue or cells from the endometrium or cervical canal. In some cases, gentle suction is applied with the curette. The curette will then be removed. The cells will be taken to a lab for testing. The procedure may vary among health care providers and hospitals. What happens after the procedure?  You may have mild cramping, backache, pain, and light bleeding or spotting. You may pass small blood clots from your vagina.  You may have to wear compression stockings. These stockings help to prevent blood clots and reduce swelling in your legs.  Your blood pressure, heart rate, breathing rate, and blood oxygen level will be monitored until the medicines you were given have worn off. Summary  Dilation and curettage (D&C) involves  stretching (dilation) the cervix and scraping (curettage) the inside lining of the uterus (endometrium).  After the procedure, you may have mild cramping, backache, pain, and light bleeding or spotting. You may pass small blood clots from your vagina.  Plan to have someone take you home from the hospital or clinic. This information is not intended to replace advice given to you by your health care provider. Make sure you discuss any questions you have with your health care provider. Document Released: 12/29/2004 Document Revised: 09/15/2015 Document Reviewed: 09/15/2015 Elsevier Interactive Patient Education  2017 [employer].  Dilation  and Curettage or Vacuum Curettage, Care After These instructions give you information about caring for yourself after your procedure. Your doctor may also give you more specific instructions. Call your doctor if you have any problems or questions after your procedure. Follow these instructions at home: Activity  Do not drive or use heavy machinery while taking prescription pain medicine.  For 24 hours after your procedure, avoid driving.  Take short walks often, followed by rest periods. Ask your doctor what activities are safe for you. After one or two days, you may be able to return to your normal activities.  Do not lift anything that is heavier than 10 lb (4.5 kg) until your doctor approves.  For at least 2 weeks, or as long as told by your doctor:  Do not douche.  Do not use tampons.  Do not have sex. General instructions  Take over-the-counter and prescription medicines only as told by your doctor. This is very important if you take blood thinning medicine.  Do not take baths, swim, or use a hot tub until your doctor approves. Take showers instead of baths.  Wear compression stockings as told by your doctor.  It is up to you to get the results of your procedure. Ask your doctor when your results will be ready.  Keep all follow-up visits  as told by your doctor. This is important. Contact a doctor if:  You have very bad cramps that get worse or do not get better with medicine.  You have very bad pain in your belly (abdomen).  You cannot drink fluids without throwing up (vomiting).  You get pain in a different part of the area between your belly and thighs (pelvis).  You have bad-smelling discharge from your vagina.  You have a rash. Get help right away if:  You are bleeding a lot from your vagina. A lot of bleeding means soaking more than one sanitary pad in an hour, for 2 hours in a row.  You have clumps of blood (blood clots) coming from your vagina.  You have a fever or chills.  Your belly feels very tender or hard.  You have chest pain.  You have trouble breathing.  You cough up blood.  You feel dizzy.  You feel light-headed.  You pass out (faint).  You have pain in your neck or shoulder area. Summary  Take short walks often, followed by rest periods. Ask your doctor what activities are safe for you. After one or two days, you may be able to return to your normal activities.  Do not lift anything that is heavier than 10 lb (4.5 kg) until your doctor approves.  Do not take baths, swim, or use a hot tub until your doctor approves. Take showers instead of baths.  Contact your doctor if you have any symptoms of infection, like bad-smelling discharge from your vagina. This information is not intended to replace advice given to you by your health care provider. Make sure you discuss any questions you have with your health care provider. Document Released: 10/08/2007 Document Revised: 09/16/2015 Document Reviewed: 09/16/2015 Elsevier Interactive Patient Education  2017 [employer] PATIENT INSTRUCTIONS POST-ANESTHESIA  IMMEDIATELY FOLLOWING SURGERY:  Do not drive or operate machinery for the first twenty four hours after surgery.  Do not make any important decisions for twenty four hours after  surgery or while taking narcotic pain medications or sedatives.  If you develop intractable nausea and vomiting or a severe headache please notify your doctor immediately.  FOLLOW-UP:  Please make an appointment with your surgeon as instructed. You do not need to follow up with anesthesia unless specifically instructed to do so.  WOUND CARE INSTRUCTIONS (if applicable):  Keep a dry clean dressing on the anesthesia/puncture wound site if there is drainage.  Once the wound has quit draining you may leave it open to air.  Generally you should leave the bandage intact for twenty four hours unless there is drainage.  If the epidural site drains for more than 36-48 hours please call the anesthesia department.  QUESTIONS?:  Please feel free to call your physician or the hospital operator if you have any questions, and they will be happy to assist you.

## 2015-12-26 ENCOUNTER — Other Ambulatory Visit: Payer: Self-pay | Admitting: Obstetrics and Gynecology

## 2015-12-27 ENCOUNTER — Encounter (HOSPITAL_COMMUNITY)
Admission: RE | Admit: 2015-12-27 | Discharge: 2015-12-27 | Disposition: A | Discharge status: Home / Self Care | Payer: Medicaid Other | Source: Ambulatory Visit | Attending: Obstetrics and Gynecology | Admitting: Obstetrics and Gynecology

## 2015-12-27 ENCOUNTER — Encounter (HOSPITAL_COMMUNITY): Payer: Self-pay

## 2015-12-27 DIAGNOSIS — Z01818 Encounter for other preprocedural examination: Secondary | ICD-10-CM | POA: Insufficient documentation

## 2015-12-27 HISTORY — DX: Headache: R51

## 2015-12-27 HISTORY — DX: Headache, unspecified: R51.9

## 2015-12-27 LAB — URINALYSIS, ROUTINE W REFLEX MICROSCOPIC
Bilirubin Urine: NEGATIVE
GLUCOSE, UA: NEGATIVE mg/dL
HGB URINE DIPSTICK: NEGATIVE
KETONES UR: NEGATIVE mg/dL
Leukocytes, UA: NEGATIVE
Nitrite: NEGATIVE
PROTEIN: NEGATIVE mg/dL
Specific Gravity, Urine: 1.012 (ref 1.005–1.030)
pH: 6 (ref 5.0–8.0)

## 2015-12-27 LAB — BASIC METABOLIC PANEL
ANION GAP: 8 (ref 5–15)
BUN: 13 mg/dL (ref 6–20)
CALCIUM: 9 mg/dL (ref 8.9–10.3)
CO2: 27 mmol/L (ref 22–32)
CREATININE: 1.12 mg/dL — AB (ref 0.44–1.00)
Chloride: 103 mmol/L (ref 101–111)
Glucose, Bld: 101 mg/dL — ABNORMAL HIGH (ref 65–99)
Potassium: 3.6 mmol/L (ref 3.5–5.1)
SODIUM: 138 mmol/L (ref 135–145)

## 2015-12-27 LAB — CBC
HCT: 35.8 % — ABNORMAL LOW (ref 36.0–46.0)
Hemoglobin: 12 g/dL (ref 12.0–15.0)
MCH: 29 pg (ref 26.0–34.0)
MCHC: 33.5 g/dL (ref 30.0–36.0)
MCV: 86.5 fL (ref 78.0–100.0)
PLATELETS: 304 10*3/uL (ref 150–400)
RBC: 4.14 MIL/uL (ref 3.87–5.11)
RDW: 12.7 % (ref 11.5–15.5)
WBC: 10.5 10*3/uL (ref 4.0–10.5)

## 2015-12-27 LAB — HCG, SERUM, QUALITATIVE: PREG SERUM: NEGATIVE

## 2015-12-30 ENCOUNTER — Telehealth: Payer: Self-pay | Admitting: Obstetrics and Gynecology

## 2015-12-30 NOTE — Telephone Encounter (Signed)
Pt called stating that she received a message stating that she should take the citrus liquid at 12 o'clock. Pt would like to know 12 o'clock midnight or Afternoon. Please contact pt

## 2015-12-30 NOTE — H&P (Signed)
Preoperative History and Physical  Donna Vaughan is a 35 y.o. [id number] here for surgical management of sterilization and endometrial ablation.   No significant preoperative concerns. LNMP was 12/04/15.   Proposed surgery: endometrial ablation and bilateral salpingectomy       Past Medical History:  Diagnosis Date  . Anemia   . Anxiety   . Depression   . Fibroids   . Hypertension   . Morbid obesity ([place])   . Pregnant   . Vitamin D insufficiency    Past Surgical History:  Procedure Laterality Date  . ADENOIDECTOMY    . CHOLECYSTECTOMY    . FOOT SURGERY    . TONSILLECTOMY                     OB History  Gravida Para Term Preterm AB Living  5 4 3 1 1 3   SAB TAB Ectopic Multiple Live Births  1     0 3    # Outcome Date GA Lbr Len/2nd Weight Sex Delivery Anes PTL Lv  5 Preterm 10/30/15 [redacted]w[redacted]d / 00:02 9.5 oz (0.269 kg) M Vag-Spont EPI  FD  4 Term 12/04/13 [redacted]w[redacted]d  6 lb 11 oz (3.033 kg) M Vag-Spont EPI N LIV  3 SAB 01/2013     SAB   FD  2 Term 11/09/07 [redacted]w[redacted]d  6 lb 12 oz (3.062 kg) M Vag-Spont EPI N LIV  1 Term 02/10/99 [redacted]w[redacted]d  8 lb 3 oz (3.714 kg) M Vag-Spont  N LIV    Patient denies any other pertinent gynecologic issues.         Current Outpatient Prescriptions on File Prior to Visit  Medication Sig Dispense Refill  . butalbital-acetaminophen-caffeine (FIORICET/CODEINE) 50-325-40-30 MG capsule Take 1 capsule by mouth every 4 (four) hours as needed for headache. 30 capsule 0  . ramipril (ALTACE) 5 MG capsule Take 1 capsule (5 mg total) by mouth daily. 30 capsule 5  . triamterene-hydrochlorothiazide (MAXZIDE) 75-50 MG tablet Take 1 tablet by mouth daily. 30 tablet 6  . Prenatal Vit-Fe Fumarate-FA (PRENATAL VITAMIN PO) Take 1 tablet by mouth every morning.      No current facility-administered medications on file prior to visit.    No Known Allergies  Social History:   reports that she has never smoked. She has never used smokeless  tobacco. She reports that she does not drink alcohol or use drugs.        Family History  Problem Relation Age of Onset  . Hypertension Brother   . Asthma Son   . Diabetes Maternal Uncle   . Stroke Maternal Grandfather   . Hypertension Paternal Grandmother   . Heart disease Paternal Grandmother     CHF  . COPD Son     bronchitis    Review of Systems: Noncontributory  PHYSICAL EXAM: Blood pressure 122/90, pulse 90, weight 264 lb 12.8 oz (120.1 kg), last menstrual period 12/04/2015. General appearance - alert, well appearing, and in no distress Chest - clear to auscultation, no wheezes, rales or rhonchi, symmetric air entry Heart - normal rate and regular rhythm Abdomen - soft, nontender, nondistended, no masses or organomegaly                     Pelvic - examination normal appearing clear cervical mucus, well supported, uterus is tiny and mobile Extremities - peripheral pulses normal, no pedal edema, no clubbing or cyanosis  Labs: No results found for this or any previous visit (  from the past 336 hour(s)).  Imaging Studies: ImagingResults  No results found.    Assessment:     Patient Active Problem List   Diagnosis Date Noted  . Prior pregnancy with fetal demise 11/12/2015  . Fibroid uterus 08/04/2014  . OBESITY 05/30/2007  . Chronic hypertension 05/30/2007    Plan: Patient will undergo surgical management with bilateral salpingectomy and Hysteroscopy dilation and curettage and endometrial ablation.    By signing my name below, I, Sonum Patel, attest that this documentation has been prepared under the direction and in the presence of Jonnie Kind, MD. Electronically Signed: Sonum Patel, Education administrator. 12/25/15. 10:33 AM.  I personally performed the services described in this documentation, which was SCRIBED in my presence. The recorded information has been reviewed and considered accurate. It has been edited as necessary during  review. Jonnie Kind, MD

## 2015-12-31 ENCOUNTER — Encounter (HOSPITAL_COMMUNITY): Payer: Self-pay | Admitting: *Deleted

## 2015-12-31 ENCOUNTER — Ambulatory Visit (HOSPITAL_COMMUNITY): Payer: Medicaid Other | Admitting: Anesthesiology

## 2015-12-31 ENCOUNTER — Ambulatory Visit (HOSPITAL_COMMUNITY)
Admission: RE | Admit: 2015-12-31 | Discharge: 2015-12-31 | Disposition: A | Discharge status: Home / Self Care | Payer: Medicaid Other | Source: Ambulatory Visit | Attending: Obstetrics and Gynecology | Admitting: Obstetrics and Gynecology

## 2015-12-31 ENCOUNTER — Encounter (HOSPITAL_COMMUNITY)
Admission: RE | Disposition: A | Discharge status: Home / Self Care | Payer: Self-pay | Source: Ambulatory Visit | Attending: Obstetrics and Gynecology

## 2015-12-31 DIAGNOSIS — E559 Vitamin D deficiency, unspecified: Secondary | ICD-10-CM | POA: Insufficient documentation

## 2015-12-31 DIAGNOSIS — D649 Anemia, unspecified: Secondary | ICD-10-CM | POA: Insufficient documentation

## 2015-12-31 DIAGNOSIS — I10 Essential (primary) hypertension: Secondary | ICD-10-CM | POA: Diagnosis not present

## 2015-12-31 DIAGNOSIS — F329 Major depressive disorder, single episode, unspecified: Secondary | ICD-10-CM | POA: Insufficient documentation

## 2015-12-31 DIAGNOSIS — Z79899 Other long term (current) drug therapy: Secondary | ICD-10-CM | POA: Diagnosis not present

## 2015-12-31 DIAGNOSIS — F419 Anxiety disorder, unspecified: Secondary | ICD-10-CM | POA: Diagnosis not present

## 2015-12-31 DIAGNOSIS — N838 Other noninflammatory disorders of ovary, fallopian tube and broad ligament: Secondary | ICD-10-CM | POA: Insufficient documentation

## 2015-12-31 DIAGNOSIS — N938 Other specified abnormal uterine and vaginal bleeding: Secondary | ICD-10-CM

## 2015-12-31 DIAGNOSIS — N920 Excessive and frequent menstruation with regular cycle: Secondary | ICD-10-CM | POA: Insufficient documentation

## 2015-12-31 DIAGNOSIS — Z302 Encounter for sterilization: Secondary | ICD-10-CM | POA: Diagnosis not present

## 2015-12-31 DIAGNOSIS — D259 Leiomyoma of uterus, unspecified: Secondary | ICD-10-CM | POA: Diagnosis not present

## 2015-12-31 HISTORY — PX: DILITATION & CURRETTAGE/HYSTROSCOPY WITH NOVASURE ABLATION: SHX5568

## 2015-12-31 HISTORY — PX: LAPAROSCOPIC BILATERAL SALPINGECTOMY: SHX5889

## 2015-12-31 HISTORY — DX: Encounter for sterilization: Z30.2

## 2015-12-31 SURGERY — DILATATION & CURETTAGE/HYSTEROSCOPY WITH NOVASURE ABLATION
Anesthesia: General | Site: Abdomen

## 2015-12-31 MED ORDER — HYDROMORPHONE HCL 1 MG/ML IJ SOLN
0.2500 mg | INTRAMUSCULAR | Status: DC | PRN
  Administered 2015-12-31 (×2): 0.5 mg via INTRAVENOUS
  Filled 2015-12-31 (×2): qty 0.5

## 2015-12-31 MED ORDER — ONDANSETRON HCL 4 MG/2ML IJ SOLN
INTRAMUSCULAR | Status: AC
  Filled 2015-12-31: qty 2

## 2015-12-31 MED ORDER — BUPIVACAINE-EPINEPHRINE (PF) 0.25% -1:200000 IJ SOLN
INTRAMUSCULAR | Status: AC
  Filled 2015-12-31: qty 30

## 2015-12-31 MED ORDER — FENTANYL CITRATE (PF) 100 MCG/2ML IJ SOLN
INTRAMUSCULAR | Status: DC | PRN
  Administered 2015-12-31 (×5): 50 ug via INTRAVENOUS

## 2015-12-31 MED ORDER — LACTATED RINGERS IV SOLN
INTRAVENOUS | Status: DC | PRN
  Administered 2015-12-31 (×2): via INTRAVENOUS

## 2015-12-31 MED ORDER — LACTATED RINGERS IV SOLN
INTRAVENOUS | Status: DC
  Administered 2015-12-31: 08:00:00 via INTRAVENOUS

## 2015-12-31 MED ORDER — ARTIFICIAL TEARS OP OINT
TOPICAL_OINTMENT | OPHTHALMIC | Status: DC | PRN
  Administered 2015-12-31: 1 via OPHTHALMIC

## 2015-12-31 MED ORDER — BUPIVACAINE-EPINEPHRINE 0.25% -1:200000 IJ SOLN
INTRAMUSCULAR | Status: DC | PRN
  Administered 2015-12-31: 20 mL

## 2015-12-31 MED ORDER — GLYCOPYRROLATE 0.2 MG/ML IJ SOLN
INTRAMUSCULAR | Status: DC | PRN
  Administered 2015-12-31: 0.6 mg via INTRAVENOUS

## 2015-12-31 MED ORDER — FENTANYL CITRATE (PF) 250 MCG/5ML IJ SOLN
INTRAMUSCULAR | Status: AC
  Filled 2015-12-31: qty 5

## 2015-12-31 MED ORDER — MIDAZOLAM HCL 2 MG/2ML IJ SOLN
1.0000 mg | INTRAMUSCULAR | Status: DC | PRN
  Administered 2015-12-31 (×2): 2 mg via INTRAVENOUS
  Filled 2015-12-31: qty 2

## 2015-12-31 MED ORDER — PHENYLEPHRINE HCL 10 MG/ML IJ SOLN
INTRAMUSCULAR | Status: DC | PRN
Start: 2015-12-31 — End: 2015-12-31
  Administered 2015-12-31: 80 ug via INTRAVENOUS

## 2015-12-31 MED ORDER — ARTIFICIAL TEARS OP OINT
TOPICAL_OINTMENT | OPHTHALMIC | Status: AC
  Filled 2015-12-31: qty 3.5

## 2015-12-31 MED ORDER — NEOSTIGMINE METHYLSULFATE 10 MG/10ML IV SOLN
INTRAVENOUS | Status: DC | PRN
  Administered 2015-12-31: 4 mg via INTRAVENOUS

## 2015-12-31 MED ORDER — PROPOFOL 10 MG/ML IV BOLUS
INTRAVENOUS | Status: DC | PRN
  Administered 2015-12-31: 50 mg via INTRAVENOUS
  Administered 2015-12-31: 150 mg via INTRAVENOUS

## 2015-12-31 MED ORDER — 0.9 % SODIUM CHLORIDE (POUR BTL) OPTIME
TOPICAL | Status: DC | PRN
  Administered 2015-12-31: 1000 mL

## 2015-12-31 MED ORDER — FENTANYL CITRATE (PF) 100 MCG/2ML IJ SOLN
INTRAMUSCULAR | Status: AC
  Filled 2015-12-31: qty 2

## 2015-12-31 MED ORDER — BUPIVACAINE HCL (PF) 0.5 % IJ SOLN
INTRAMUSCULAR | Status: AC
  Filled 2015-12-31: qty 30

## 2015-12-31 MED ORDER — SODIUM CHLORIDE 0.9 % IR SOLN
Status: DC | PRN
  Administered 2015-12-31: 3000 mL

## 2015-12-31 MED ORDER — KETOROLAC TROMETHAMINE 10 MG PO TABS: 10.0000 mg | ORAL_TABLET | Freq: Four times a day (QID) | ORAL | 0 refills | Status: DC | PRN

## 2015-12-31 MED ORDER — PROPOFOL 10 MG/ML IV BOLUS
INTRAVENOUS | Status: AC
  Filled 2015-12-31: qty 40

## 2015-12-31 MED ORDER — ONDANSETRON HCL 4 MG/2ML IJ SOLN
4.0000 mg | Freq: Once | INTRAMUSCULAR | Status: AC
  Administered 2015-12-31: 4 mg via INTRAVENOUS

## 2015-12-31 MED ORDER — MIDAZOLAM HCL 2 MG/2ML IJ SOLN
INTRAMUSCULAR | Status: AC
  Filled 2015-12-31: qty 2

## 2015-12-31 MED ORDER — HYDROCODONE-ACETAMINOPHEN 5-325 MG PO TABS: 1.0000 | ORAL_TABLET | Freq: Four times a day (QID) | ORAL | 0 refills | Status: DC | PRN

## 2015-12-31 MED ORDER — LIDOCAINE HCL (CARDIAC) 20 MG/ML IV SOLN
INTRAVENOUS | Status: DC | PRN
  Administered 2015-12-31: 40 mg via INTRAVENOUS
  Administered 2015-12-31: 35 mg via INTRAVENOUS

## 2015-12-31 SURGICAL SUPPLY — 50 items
ABLATOR ENDOMETRIAL BIPOLAR (ABLATOR) ×5 IMPLANT
BAG HAMPER (MISCELLANEOUS) ×5 IMPLANT
BANDAGE STRIP 1X3 FLEXIBLE (GAUZE/BANDAGES/DRESSINGS) ×13 IMPLANT
BLADE SURG SZ11 CARB STEEL (BLADE) ×5 IMPLANT
CATH ROBINSON RED A/P 16FR (CATHETERS) ×5 IMPLANT
CLOSURE WOUND 1/2 X4 (GAUZE/BANDAGES/DRESSINGS) ×1
CLOSURE WOUND 1/4 X3 (GAUZE/BANDAGES/DRESSINGS) ×1
CLOTH BEACON ORANGE TIMEOUT ST (SAFETY) ×5 IMPLANT
COVER LIGHT HANDLE STERIS (MISCELLANEOUS) ×10 IMPLANT
COVER MAYO STAND XLG (DRAPE) ×5 IMPLANT
DECANTER SPIKE VIAL GLASS SM (MISCELLANEOUS) ×5 IMPLANT
DURAPREP 26ML APPLICATOR (WOUND CARE) ×3 IMPLANT
ELECT REM PT RETURN 9FT ADLT (ELECTROSURGICAL) ×5
ELECTRODE REM PT RTRN 9FT ADLT (ELECTROSURGICAL) ×3 IMPLANT
FORMALIN 10 PREFIL 120ML (MISCELLANEOUS) ×8 IMPLANT
GLOVE BIOGEL PI IND STRL 7.0 (GLOVE) ×3 IMPLANT
GLOVE BIOGEL PI IND STRL 9 (GLOVE) ×3 IMPLANT
GLOVE BIOGEL PI INDICATOR 7.0 (GLOVE) ×2
GLOVE BIOGEL PI INDICATOR 9 (GLOVE) ×2
GLOVE ECLIPSE 6.5 STRL STRAW (GLOVE) ×3 IMPLANT
GLOVE ECLIPSE 9.0 STRL (GLOVE) ×8 IMPLANT
GLOVE EXAM NITRILE MD LF STRL (GLOVE) ×3 IMPLANT
GOWN SPEC L3 XXLG W/TWL (GOWN DISPOSABLE) ×7 IMPLANT
GOWN STRL REUS W/TWL LRG LVL3 (GOWN DISPOSABLE) ×5 IMPLANT
INST SET HYSTEROSCOPY (KITS) ×5 IMPLANT
INST SET LAPROSCOPIC GYN AP (KITS) ×5 IMPLANT
IV NS IRRIG 3000ML ARTHROMATIC (IV SOLUTION) ×5 IMPLANT
KIT ROOM TURNOVER AP CYSTO (KITS) ×5 IMPLANT
KIT ROOM TURNOVER APOR (KITS) ×5 IMPLANT
MANIFOLD NEPTUNE II (INSTRUMENTS) ×5 IMPLANT
NDL INSUFFLATION 14GA 120MM (NEEDLE) IMPLANT
NEEDLE INSUFFLATION 14GA 120MM (NEEDLE) IMPLANT
NS IRRIG 1000ML POUR BTL (IV SOLUTION) ×5 IMPLANT
PACK PERI GYN (CUSTOM PROCEDURE TRAY) ×5 IMPLANT
PAD ARMBOARD 7.5X6 YLW CONV (MISCELLANEOUS) ×5 IMPLANT
PAD TELFA 3X4 1S STER (GAUZE/BANDAGES/DRESSINGS) ×5 IMPLANT
SET BASIN LINEN APH (SET/KITS/TRAYS/PACK) ×5 IMPLANT
SET IRRIG Y TYPE TUR BLADDER L (SET/KITS/TRAYS/PACK) ×5 IMPLANT
SOL PREP PROV IODINE SCRUB 4OZ (MISCELLANEOUS) ×5 IMPLANT
SOLUTION ANTI FOG 6CC (MISCELLANEOUS) ×5 IMPLANT
STRIP CLOSURE SKIN 1/2X4 (GAUZE/BANDAGES/DRESSINGS) ×2 IMPLANT
STRIP CLOSURE SKIN 1/4X3 (GAUZE/BANDAGES/DRESSINGS) ×4 IMPLANT
SUT VIC AB 4-0 PS2 27 (SUTURE) IMPLANT
SYR 30ML LL (SYRINGE) ×2 IMPLANT
SYR BULB IRRIGATION 50ML (SYRINGE) ×3 IMPLANT
SYR CONTROL 10ML LL (SYRINGE) ×5 IMPLANT
SYRINGE 10CC LL (SYRINGE) ×10 IMPLANT
TROCAR Z-THREAD FIOS 5X100MM (TROCAR) ×5 IMPLANT
TUBING INSUFFLATION (TUBING) ×5 IMPLANT
WARMER LAPAROSCOPE (MISCELLANEOUS) ×5 IMPLANT

## 2015-12-31 NOTE — Discharge Instructions (Signed)
Hysteroscopy Hysteroscopy is a procedure used for looking inside the womb (uterus). It may be done for various reasons, including:  To evaluate abnormal bleeding, fibroid (benign, noncancerous) tumors, polyps, scar tissue (adhesions), and possibly cancer of the uterus.  To look for lumps (tumors) and other uterine growths.  To look for causes of why a woman cannot get pregnant (infertility), causes of recurrent loss of pregnancy (miscarriages), or a lost intrauterine device (IUD).  To perform a sterilization by blocking the fallopian tubes from inside the uterus. In this procedure, a thin, flexible tube with a tiny light and camera on the end of it (hysteroscope) is used to look inside the uterus. A hysteroscopy should be done right after a menstrual period to be sure you are not pregnant. LET [facility] CARE PROVIDER KNOW ABOUT:   Any allergies you have.  All medicines you are taking, including vitamins, herbs, eye drops, creams, and over-the-counter medicines.  Previous problems you or members of your family have had with the use of anesthetics.  Any blood disorders you have.  Previous surgeries you have had.  Medical conditions you have. RISKS AND COMPLICATIONS  Generally, this is a safe procedure. However, as with any procedure, complications can occur. Possible complications include:  Putting a hole in the uterus.  Excessive bleeding.  Infection.  Damage to the cervix.  Injury to other organs.  Allergic reaction to medicines.  Too much fluid used in the uterus for the procedure. BEFORE THE PROCEDURE   Ask your health care provider about changing or stopping any regular medicines.  Do not take aspirin or blood thinners for 1 week before the procedure, or as directed by your health care provider. These can cause bleeding.  If you smoke, do not smoke for 2 weeks before the procedure.  In some cases, a medicine is placed in the cervix the day before the procedure.  This medicine makes the cervix have a larger opening (dilate). This makes it easier for the instrument to be inserted into the uterus during the procedure.  Do not eat or drink anything for at least 8 hours before the surgery.  Arrange for someone to take you home after the procedure. PROCEDURE   You may be given a medicine to relax you (sedative). You may also be given one of the following:  A medicine that numbs the area around the cervix (local anesthetic).  A medicine that makes you sleep through the procedure (general anesthetic).  The hysteroscope is inserted through the vagina into the uterus. The camera on the hysteroscope sends a picture to a TV screen. This gives the surgeon a good view inside the uterus.  During the procedure, air or a liquid is put into the uterus, which allows the surgeon to see better.  Sometimes, tissue is gently scraped from inside the uterus. These tissue samples are sent to a lab for testing. AFTER THE PROCEDURE   If you had a general anesthetic, you may be groggy for a couple hours after the procedure.  If you had a local anesthetic, you will be able to go home as soon as you are stable and feel ready.  You may have some cramping. This normally lasts for a couple days.  You may have bleeding, which varies from light spotting for a few days to menstrual-like bleeding for 3-7 days. This is normal.  If your test results are not back during the visit, make an appointment with your health care provider to find out the  results. This information is not intended to replace advice given to you by your health care provider. Make sure you discuss any questions you have with your health care provider. Document Released: 04/06/2000 Document Revised: 10/19/2012 Document Reviewed: 07/28/2012 Elsevier Interactive Patient Education  2017 [employer]  Laparoscopic Tubal Ligation, Care After Refer to this sheet in the next few weeks. These instructions provide  you with information about caring for yourself after your procedure. Your health care provider may also give you more specific instructions. Your treatment has been planned according to current medical practices, but problems sometimes occur. Call your health care provider if you have any problems or questions after your procedure. What can I expect after the procedure? After the procedure, it is common to have:  A sore throat.  Discomfort in your shoulder.  Mild discomfort or cramping in your abdomen.  Gas pains.  Pain or soreness in the area where the surgical cut (incision) was made.  A bloated feeling.  Tiredness.  Nausea.  Vomiting. Follow these instructions at home: Medicines  Take over-the-counter and prescription medicines only as told by your health care provider.  Do not take aspirin because it can cause bleeding.  Do not drive or operate heavy machinery while taking prescription pain medicine. Activity  Rest for the rest of the day.  Return to your normal activities as told by your health care provider. Ask your health care provider what activities are safe for you. Incision care  Follow instructions from your health care provider about how to take care of your incision. Make sure you:  Wash your hands with soap and water before you change your bandage (dressing). If soap and water are not available, use hand sanitizer.  Change your dressing as told by your health care provider.  Leave stitches (sutures) in place. They may need to stay in place for 2 weeks or longer.  Check your incision area every day for signs of infection. Check for:  More redness, swelling, or pain.  More fluid or blood.  Warmth.  Pus or a bad smell. Other Instructions  Do not take baths, swim, or use a hot tub until your health care provider approves. You may take showers.  Keep all follow-up visits as told by your health care provider. This is important.  Have someone help  you with your daily household tasks for the first few days. Contact a health care provider if:  You have more redness, swelling, or pain around your incision.  Your incision feels warm to the touch.  You have pus or a bad smell coming from your incision.  The edges of your incision break open after the sutures have been removed.  Your pain does not improve after 2-3 days.  You have a rash.  You repeatedly become dizzy or light-headed.  Your pain medicine is not helping.  You are constipated. Get help right away if:  You have a fever.  You faint.  You have increasing pain in your abdomen.  You have severe pain in one or both of your shoulders.  You have fluid or blood coming from your sutures or from your vagina.  You have shortness of breath or difficulty breathing.  You have chest pain or leg pain.  You have ongoing nausea, vomiting, or diarrhea. This information is not intended to replace advice given to you by your health care provider. Make sure you discuss any questions you have with your health care provider. Document Released: 07/18/2004 Document Revised:  06/03/2015 Document Reviewed: 12/09/2014 Elsevier Interactive Patient Education  2017 [employer].

## 2015-12-31 NOTE — Op Note (Signed)
Please see the brief operative note for surgical details It should be noted that during the hysteroscopy component of the vaginal portion of the case we were initially unable to utilize the first hysteroscope due to occlusion of the the channel inthe hysteroscope itself.. We identified what appeared to be dried debris at the start of the channel The device was not used on the patient as we could not irrigate and distend the endometrial cavity. A second hysteroscope was used visualize endometrial cavity, and then proceed with the endometrial ablation as per protocol.

## 2015-12-31 NOTE — Telephone Encounter (Signed)
Attempted to call patient about sodium citrate but realized patient is having surgery now. Will close this encounter.

## 2015-12-31 NOTE — Anesthesia Preprocedure Evaluation (Signed)
Anesthesia Evaluation  Patient identified by MRN, date of birth, ID band Patient awake    Reviewed: Allergy & Precautions, NPO status , Patient's Chart, lab work & pertinent test results  Airway Mallampati: II  TM Distance: >3 FB Neck ROM: Full    Dental  (+) Teeth Intact   Pulmonary neg pulmonary ROS,    breath sounds clear to auscultation       Cardiovascular hypertension, Pt. on medications  Rhythm:Regular Rate:Normal     Neuro/Psych  Headaches, PSYCHIATRIC DISORDERS Anxiety Depression    GI/Hepatic negative GI ROS,   Endo/Other  Morbid obesity  Renal/GU      Musculoskeletal   Abdominal   Peds  Hematology  (+) anemia ,   Anesthesia Other Findings   Reproductive/Obstetrics                             Anesthesia Physical Anesthesia Plan  ASA: II  Anesthesia Plan: General   Post-op Pain Management:    Induction: Intravenous  Airway Management Planned: Oral ETT  Additional Equipment:   Intra-op Plan:   Post-operative Plan: Extubation in OR  Informed Consent: I have reviewed the patients History and Physical, chart, labs and discussed the procedure including the risks, benefits and alternatives for the proposed anesthesia with the patient or authorized representative who has indicated his/her understanding and acceptance.     Plan Discussed with:   Anesthesia Plan Comments:         Anesthesia Quick Evaluation  

## 2015-12-31 NOTE — Transfer of Care (Signed)
Immediate Anesthesia Transfer of Care Note  Patient: Donna Vaughan  Procedure(s) Performed: Procedure(s): DILATATION & CURETTAGE/HYSTEROSCOPY WITH ENDOMETRIAL  ABLATION (N/A) LAPAROSCOPIC TUBAL LIGATION (Bilateral)  Patient Location: PACU  Anesthesia Type:MAC  Level of Consciousness: awake, alert , oriented and patient cooperative  Airway & Oxygen Therapy: Patient Spontanous Breathing and Patient connected to face mask oxygen  Post-op Assessment: Report given to RN and Post -op Vital signs reviewed and stable  Post vital signs: Reviewed and stable  Last Vitals:  Vitals:   12/31/15 0850 12/31/15 0855  BP: 124/72   Resp: 12 13  Temp:      Last Pain:  Vitals:   12/31/15 0720  TempSrc: Oral      Patients Stated Pain Goal: 4 ([id number])  Complications: No apparent anesthesia complications

## 2015-12-31 NOTE — Anesthesia Procedure Notes (Signed)
Procedure Name: MAC Date/Time: 12/31/2015 9:23 AM Performed by: Andree Elk, AMY A Pre-anesthesia Checklist: Patient identified, Patient being monitored, Timeout performed, Emergency Drugs available and Suction available Patient Re-evaluated:Patient Re-evaluated prior to inductionOxygen Delivery Method: Circle System Utilized Preoxygenation: Pre-oxygenation with 100% oxygen Intubation Type: IV induction Ventilation: Mask ventilation without difficulty Laryngoscope Size: Miller and 3 Grade View: Grade I Tube type: Oral Tube size: 7.0 mm Number of attempts: 1 Airway Equipment and Method: Stylet Placement Confirmation: ETT inserted through vocal cords under direct vision,  positive ETCO2 and breath sounds checked- equal and bilateral Secured at: 21 cm Tube secured with: Tape Dental Injury: Teeth and Oropharynx as per pre-operative assessment

## 2015-12-31 NOTE — Brief Op Note (Signed)
12/31/2015  10:27 AM  PATIENT:  Donna Vaughan  35 y.o. female  PRE-OPERATIVE DIAGNOSIS:  abnormal uterine bleeding (heavy periods) sterilization  POST-OPERATIVE DIAGNOSIS:  abnormal uterine bleeding heavy periods) sterilization  PROCEDURE:  Procedure(s): DILATATION & CURETTAGE/HYSTEROSCOPY WITH ENDOMETRIAL  ABLATION (N/A) LAPAROSCOPIC TUBAL LIGATION (Bilateral)  SURGEON:  Surgeon(s) and Role:    * Jonnie Kind, MD - Primary  PHYSICIAN ASSISTANT:   ASSISTANTS: none   ANESTHESIA:   local, general and paracervical block  EBL:  Total I/O In: 1000 [I.V.:1000] Out: 405 [Urine:400; Blood:5]  BLOOD ADMINISTERED:none  DRAINS: none   LOCAL MEDICATIONS USED:  MARCAINE    and Amount: 20 ml  SPECIMEN:  Source of Specimen:  Bilateral fallopian tubes, endometrial curettings  DISPOSITION OF SPECIMEN:  PATHOLOGY  COUNTS:  YES  TOURNIQUET:  * No tourniquets in log *  DICTATION: .Dragon Dictation  PLAN OF CARE: Discharge to home after PACU  PATIENT DISPOSITION:  PACU - hemodynamically stable.   Delay start of Pharmacological VTE agent (>24hrs) due to surgical blood loss or risk of bleeding: not applicable Details of procedure: Patient was taken operating room prepped and draped for combined abdominal and vaginal procedure with legs in the low lithotomy support, with in and out catheterization the bladder. Timeout was conducted and procedure confirmed by surgical team. Single-tooth tenaculum was attached to the cervix for uterine manipulation. The abdomen was addressed first with infraumbilical suprapubic and right lower quadrant 1 cm incisions performed with Veress needle used to achieve pneumoperitoneum through the umbilicus under 11 mmHg pressure and laparoscopic trocar introduced revealing normal abdominal cavity contents with no suspicion of trauma associated with entry. Uterus could be visualized and was photo documented. The left fallopian tube could be elevated first and  using harmonic scalpel through the suprapubic and right lower quadrant trocar sites we were able to perform salpingectomy with the Harmonic scalpel in standard fashion hemostasis was excellent. This the tube was extracted through the suprapubic site. On the right side a similar process was performed and the tissue specimen extracted. Saline 120 cc was instilled into the abdomen, the abdomen deflated, laparoscopic trochars removed and subcuticular 4-0 Vicryl closure the skin incisions performed  Endometrial ablation: Attention was then directed to the vagina. Speculum was reinserted, cervix grasped with single-tooth tenaculum, sounded to 11 cm, and paracervical block using Marcaine with epinephrine paracervical block. The cervix was dilated to 25 [place] allowing introduction of the hysteroscope which visualized some secret Tory tissue in the endometrial cavity and normal tubal ostia bilaterally and no suspicion of torsion of the endometrial cavity. Smooth sharp curettage was gently performed obtaining tissue samples which were sent for pathology. The NovaSure endometrial ablation device was then inserted with a uterine cavity length of 6 cm, with 3.2 cm with power 106 W for time of 1 minute and 9 second activation- The device was then removed and patient allowed to go recovery room in stable condition sponge and needle counts correct

## 2015-12-31 NOTE — Anesthesia Postprocedure Evaluation (Signed)
Anesthesia Post Note  Patient: Donna Vaughan  Procedure(s) Performed: Procedure(s) (LRB): DILATATION & CURETTAGE/HYSTEROSCOPY WITH ENDOMETRIAL  ABLATION (N/A) LAPAROSCOPIC BILATERAL SALPINGECTOMY (Bilateral)  Patient location during evaluation: PACU Anesthesia Type: General Level of consciousness: awake and alert and oriented Pain management: pain level controlled Vital Signs Assessment: post-procedure vital signs reviewed and stable Respiratory status: spontaneous breathing Cardiovascular status: stable Postop Assessment: no signs of nausea or vomiting Anesthetic complications: no     Last Vitals:  Vitals:   12/31/15 0855 12/31/15 1030  BP:  127/70  Pulse:  88  Resp: 13 (!) 1  Temp:  36.8 C    Last Pain:  Vitals:   12/31/15 1030  TempSrc:   PainSc: Asleep                 ADAMS, AMY A

## 2015-12-31 NOTE — Interval H&P Note (Signed)
History and Physical Interval Note:  12/31/2015 8:54 AM  Donna Vaughan  has presented today for surgery, with the diagnosis of abnormal uterine bleeding sterilization  The various methods of treatment have been discussed with the patient and family. After consideration of risks, benefits and other options for treatment, the patient has consented to  Procedure(s): DILATATION & CURETTAGE/HYSTEROSCOPY WITH ENDOMETRIAL  ABLATION (N/A) LAPAROSCOPIC TUBAL LIGATION (Bilateral) as a surgical intervention .  The patient's history has been reviewed, patient examined, no change in status, stable for surgery.  I have reviewed the patient's chart and labs.  Questions were answered to the patient's satisfaction.     Jonnie Kind

## 2016-01-01 ENCOUNTER — Encounter (HOSPITAL_COMMUNITY): Payer: Self-pay | Admitting: Obstetrics and Gynecology

## 2016-01-09 ENCOUNTER — Other Ambulatory Visit: Payer: Medicaid Other | Admitting: Adult Health

## 2016-01-16 ENCOUNTER — Encounter: Payer: Medicaid Other | Admitting: Obstetrics and Gynecology

## 2016-01-23 ENCOUNTER — Ambulatory Visit (INDEPENDENT_AMBULATORY_CARE_PROVIDER_SITE_OTHER): Payer: Self-pay | Admitting: Obstetrics and Gynecology

## 2016-01-23 ENCOUNTER — Encounter: Payer: Self-pay | Admitting: Obstetrics and Gynecology

## 2016-01-23 VITALS — BP 102/82 | HR 108

## 2016-01-23 DIAGNOSIS — Z9889 Other specified postprocedural states: Secondary | ICD-10-CM

## 2016-01-23 DIAGNOSIS — Z09 Encounter for follow-up examination after completed treatment for conditions other than malignant neoplasm: Secondary | ICD-10-CM

## 2016-01-23 NOTE — Progress Notes (Signed)
Patient ID: Donna Vaughan, female   DOB: 1980/10/23, 36 y.o.   MRN: [id number] Subjective:  Donna Vaughan is a 36 y.o. female now 3 weeks 2 days status post D&C/Hysterocopy with endometrial ablation. Laproscopic bilateral salpingectomy Pt notes that she is having associated symptoms of watery vaginal discharge. Pt hasn't tried any medications for the relief of her symptoms. Pt denies any other symptoms.  Review of Systems Negative except   Diet:  negative   Bowel movements : normal.  The patient is not having any pain.  Objective:  BP 102/82 (BP Location: Right Arm, Patient Position: Sitting, Cuff Size: Large)   Pulse (!) 108  General:Well developed, well nourished.  No acute distress. Abdomen: Bowel sounds normal, soft, non-tender. Pelvic Exam:    External Genitalia:  Normal.    Vagina: Normal    Cervix: Normal    Uterus: Normal    Adnexa/Bimanual: Normal  Incision(s):   Healing well, no drainage, no erythema, no hernia, no swelling, no dehiscence,  Assessment:  Post-Op 3 weeks 2 days s/p D&C/Hysterocopy with endometrial ablation. Laproscopic bilateral salpingectomy   Doing well postoperatively.   Plan:  1.Wound care discussed   2. . current medications. 3. Activity restrictions: desk job only 4. return to work: not applicable. 5. Follow up in 3 years or PRN.  By signing my name below, I, Soijett Blue, attest that this documentation has been prepared under the direction and in the presence of Jonnie Kind, MD. Electronically Signed: [place], ED Scribe. 01/23/16. 3:02 PM.  I personally performed the services described in this documentation, which was SCRIBED in my presence. The recorded information has been reviewed and considered accurate. It has been edited as necessary during review. Jonnie Kind, MD

## 2016-01-28 ENCOUNTER — Telehealth: Payer: Self-pay | Admitting: Obstetrics and Gynecology

## 2016-02-03 ENCOUNTER — Telehealth: Payer: Self-pay | Admitting: Obstetrics and Gynecology

## 2016-02-03 MED ORDER — MEGESTROL ACETATE 40 MG PO TABS: 40.0000 mg | ORAL_TABLET | Freq: Three times a day (TID) | ORAL | 2 refills | Status: DC

## 2016-02-03 NOTE — Telephone Encounter (Signed)
Please call patient regarding bleeding.

## 2016-02-03 NOTE — Telephone Encounter (Signed)
Spotted since ablation with 2 episodes of heavier bleeding she equates to a menses. Will try Megace tid F/u by appt if bleeding persists.

## 2016-02-05 ENCOUNTER — Telehealth: Payer: Self-pay | Admitting: Obstetrics and Gynecology

## 2016-02-05 NOTE — Telephone Encounter (Signed)
Pt called stating that she is returning Tish's Phone call. Please contact pt

## 2016-02-06 NOTE — Telephone Encounter (Signed)
Patient states Dr Glo Herring called her back and answered all questions.

## 2016-03-16 ENCOUNTER — Ambulatory Visit: Payer: Self-pay | Admitting: Obstetrics & Gynecology

## 2016-03-18 ENCOUNTER — Ambulatory Visit: Payer: Self-pay | Admitting: Obstetrics and Gynecology

## 2016-03-23 ENCOUNTER — Ambulatory Visit: Payer: Self-pay | Admitting: Obstetrics and Gynecology

## 2016-03-24 ENCOUNTER — Ambulatory Visit: Payer: Self-pay | Admitting: Obstetrics and Gynecology

## 2016-04-01 ENCOUNTER — Encounter: Payer: Self-pay | Admitting: Obstetrics and Gynecology

## 2016-04-01 ENCOUNTER — Ambulatory Visit (INDEPENDENT_AMBULATORY_CARE_PROVIDER_SITE_OTHER): Payer: Medicaid Other | Admitting: Obstetrics and Gynecology

## 2016-04-01 VITALS — BP 118/78 | HR 96 | Wt 266.0 lb

## 2016-04-01 DIAGNOSIS — D250 Submucous leiomyoma of uterus: Secondary | ICD-10-CM | POA: Diagnosis not present

## 2016-04-01 DIAGNOSIS — N921 Excessive and frequent menstruation with irregular cycle: Secondary | ICD-10-CM | POA: Diagnosis not present

## 2016-04-01 DIAGNOSIS — D251 Intramural leiomyoma of uterus: Secondary | ICD-10-CM

## 2016-04-01 NOTE — Progress Notes (Addendum)
[facility] Visit  04/01/2016            Patient name: Donna Vaughan MRN [id number]  Date of birth: 1980/01/17  CC & HPI:  Donna Vaughan is a 36 y.o. female presenting today for heavy frequent menstrual periods. She initially had an IUD placed but it came out. She is 3 months s/p ablation in December 2017 and has been taking Megace but is still having heavy bleeding. Pt has a family history of Hysterectomy--multiple women on mother's side.    ROS:  ROS  Otherwise negative for acute change except as noted in the HPI.  Pertinent History Reviewed:   Reviewed: Significant for D&C, endometrial ablation, uterine fibroids, and laproscopic bilateral salpingectomy  Medical         Past Medical History:  Diagnosis Date   Anemia    Anxiety    Depression    Fibroids    Headache    Hypertension    Morbid obesity ([place])    Pregnant    Vitamin D insufficiency                               Surgical Hx:    Past Surgical History:  Procedure Laterality Date   ADENOIDECTOMY     CHOLECYSTECTOMY     DILITATION & CURRETTAGE/HYSTROSCOPY WITH NOVASURE ABLATION N/A 12/31/2015   Procedure: DILATATION & CURETTAGE/HYSTEROSCOPY WITH ENDOMETRIAL  ABLATION;  Surgeon: Jonnie Kind, MD;  Location: [facility];  Service: Gynecology;  Laterality: N/A;   FOOT SURGERY Left    removal of foreign body   LAPAROSCOPIC BILATERAL SALPINGECTOMY Bilateral 12/31/2015   Procedure: LAPAROSCOPIC BILATERAL SALPINGECTOMY;  Surgeon: Jonnie Kind, MD;  Location: [facility];  Service: Gynecology;  Laterality: Bilateral;   TONSILLECTOMY     Medications: Reviewed & Updated - see associated section                       Current Outpatient Prescriptions:    HYDROcodone-acetaminophen (NORCO/VICODIN) 5-325 MG tablet, Take 1 tablet by mouth every 6 (six) hours as needed for moderate pain. May take with ibuprofen, Disp: 15 tablet, Rfl: 0   ramipril (ALTACE) 5 MG capsule, Take 1 capsule (5 mg total) by  mouth daily., Disp: 30 capsule, Rfl: 5   triamterene-hydrochlorothiazide (MAXZIDE) 75-50 MG tablet, Take 1 tablet by mouth daily., Disp: 30 tablet, Rfl: 6   butalbital-acetaminophen-caffeine (FIORICET/CODEINE) 50-325-40-30 MG capsule, Take 1 capsule by mouth every 4 (four) hours as needed for headache. (Patient not taking: Reported on 04/01/2016), Disp: 30 capsule, Rfl: 0   megestrol (MEGACE) 40 MG tablet, Take 1 tablet (40 mg total) by mouth 3 (three) times daily. (Patient not taking: Reported on 04/01/2016), Disp: 45 tablet, Rfl: 2   Social History: Reviewed -  reports that she has never smoked. She has never used smokeless tobacco.  Objective Findings:  Vitals: Blood pressure 118/78, pulse 96, weight 266 lb (120.7 kg).  Physical Examination: General appearance - alert, well appearing, and in no distress Mental status - alert, oriented to person, place, and time Abdomen - soft, nontender, nondistended, no masses or organomegaly Pelvic -  VULVA: normal appearing vulva with no masses, tenderness or lesions,  VAGINA: Heavy menstrual type flow; dark red, no clots    The provider spent over 25 minutes with the visit , including previsit review, and documentation,with >than 50% spent in discussing varying hysterectomy strategies and  coordination of care. Pt would like to have a supracervical hysterectomy.   Assessment & Plan:   A:  1. Menorrhagia  2, Failed IUD 3. Failed endometrial ablation  P:  1. Bleeding uncontrolled will stop megace and recommend hysterectomy  Risk benefit, rationale discussed; pt desires to proceed to hysterectomy with preservation of cervix.(SUPRACERVICAL HYSTERECTOMY Pt has already had bilateral salpingectomy  Pt will have preservation of ovaries. D/C MEGACE   By signing my name below, I, Evelene Croon, attest that this documentation has been prepared under the direction and in the presence of Jonnie Kind, MD . Electronically Signed: Evelene Croon,  Scribe. 04/01/2016. 2:05 PM. I personally performed the services described in this documentation, which was SCRIBED in my presence. The recorded information has been reviewed and considered accurate. It has been edited as necessary during review. Jonnie Kind, MD

## 2016-04-02 NOTE — Patient Instructions (Signed)
Abdominal Hysterectomy, Care After °This sheet gives you information about how to care for yourself after your procedure. Your doctor may also give you more specific instructions. If you have problems or questions, contact your doctor. °Follow these instructions at home: °Bathing °· Do not take baths, swim, or use a hot tub until your doctor says it is okay. Ask your doctor if you can take showers. You may only be allowed to take sponge baths for bathing. °· Keep the bandage (dressing) dry until your doctor says it can be taken off. °Surgical cut ( °incision) care °· Follow instructions from your doctor about how to take care of your cut from surgery. Make sure you: °? Wash your hands with soap and water before you change your bandage (dressing). If you cannot use soap and water, use hand sanitizer. °? Change your bandage as told by your doctor. °? Leave stitches (sutures), skin glue, or skin tape (adhesive) strips in place. They may need to stay in place for 2 weeks or longer. If tape strips get loose and curl up, you may trim the loose edges. Do not remove tape strips completely unless your doctor says it is okay. °· Check your surgical cut area every day for signs of infection. Check for: °? Redness, swelling, or pain. °? Fluid or blood. °? Warmth. °? Pus or a bad smell. °Activity °· Do gentle, daily exercise as told by your doctor. You may be told to take short walks every day and go farther each time. °· Do not lift anything that is heavier than 10 lb (4.5 kg), or the limit that your doctor tells you, until he or she says that it is safe. °· Do not drive or use heavy machinery while taking prescription pain medicine. °· Do not drive for 24 hours if you were given a medicine to help you relax (sedative). °· Follow your doctor's advice about exercise, driving, and general activities. Ask your doctor what activities are safe for you. °Lifestyle °· Do not douche, use tampons, or have sex for at least 6 weeks or as  told by your doctor. °· Do not drink alcohol until your doctor says it is okay. °· Drink enough fluid to keep your pee (urine) clear or pale yellow. °· Try to have someone at home with you for the first 1-2 weeks to help. °· Do not use any products that contain nicotine or tobacco, such as cigarettes and e-cigarettes. These can slow down healing. If you need help quitting, ask your doctor. °General instructions °· Take over-the-counter and prescription medicines only as told by your doctor. °· Do not take aspirin or ibuprofen. These medicines can cause bleeding. °· To prevent or treat constipation while you are taking prescription pain medicine, your doctor may suggest that you: °? Drink enough fluid to keep your urine clear or pale yellow. °? Take over-the-counter or prescription medicines. °? Eat foods that are high in fiber, such as: °§ Fresh fruits and vegetables. °§ Whole grains. °§ Beans. °? Limit foods that are high in fat and processed sugars, such as fried and sweet foods. °· Keep all follow-up visits as told by your doctor. This is important. °Contact a doctor if: °· You have chills or fever. °· You have redness, swelling, or pain around your cut. °· You have fluid or blood coming from your cut. °· Your cut feels warm to the touch. °· You have pus or a bad smell coming from your cut. °· Your cut breaks   open. °· You feel dizzy or light-headed. °· You have pain or bleeding when you pee. °· You keep having watery poop (diarrhea). °· You keep feeling sick to your stomach (nauseous) or keep throwing up (vomiting). °· You have unusual fluid (discharge) coming from your vagina. °· You have a rash. °· You have a reaction to your medicine. °· Your pain medicine does not help. °Get help right away if: °· You have a fever and your symptoms get worse all of a sudden. °· You have very bad belly (abdominal) pain. °· You are short of breath. °· You pass out (faint). °· You have pain, swelling, or redness of your  leg. °· You bleed a lot from your vagina and notice clumps of blood (clots). °Summary °· Do not take baths, swim, or use a hot tub until your doctor says it is okay. Ask your doctor if you can take showers. You may only be allowed to take sponge baths for bathing. °· Follow your doctor's advice about exercise, driving, and general activities. Ask your doctor what activities are safe for you. °· Do not lift anything that is heavier than 10 lb (4.5 kg), or the limit that your doctor tells you, until he or she says that it is safe. °· Try to have someone at home with you for the first 1-2 weeks to help. °This information is not intended to replace advice given to you by your health care provider. Make sure you discuss any questions you have with your health care provider. °Document Released: 10/08/2007 Document Revised: 12/18/2015 Document Reviewed: 12/18/2015 °Elsevier Interactive Patient Education © 2017 [employer] ° °

## 2016-04-02 NOTE — Progress Notes (Signed)
[facility] Visit  04/01/2016            Patient name: Donna Vaughan MRN [id number]  Date of birth: 1980/10/17  CC & HPI:  Donna Vaughan is a 36 y.o. female presenting today for heavy frequent menstrual periods. She initially had an IUD placed but it came out. She is 3 months s/p ablation in December 2017 and has been taking Megace but is still having heavy bleeding. Pt has a family history of Hysterectomy--multiple women on mother's side.    ROS:  ROS  Otherwise negative for acute change except as noted in the HPI.  Pertinent History Reviewed:   Reviewed: Significant for D&C, endometrial ablation, uterine fibroids, and laproscopic bilateral salpingectomy  Medical         Past Medical History:  Diagnosis Date  . Anemia   . Anxiety   . Depression   . Fibroids   . Headache   . Hypertension   . Morbid obesity ([place])   . Pregnant   . Vitamin D insufficiency                               Surgical Hx:    Past Surgical History:  Procedure Laterality Date  . ADENOIDECTOMY    . CHOLECYSTECTOMY    . DILITATION & CURRETTAGE/HYSTROSCOPY WITH NOVASURE ABLATION N/A 12/31/2015   Procedure: DILATATION & CURETTAGE/HYSTEROSCOPY WITH ENDOMETRIAL  ABLATION;  Surgeon: Jonnie Kind, MD;  Location: [facility];  Service: Gynecology;  Laterality: N/A;  . FOOT SURGERY Left    removal of foreign body  . LAPAROSCOPIC BILATERAL SALPINGECTOMY Bilateral 12/31/2015   Procedure: LAPAROSCOPIC BILATERAL SALPINGECTOMY;  Surgeon: Jonnie Kind, MD;  Location: [facility];  Service: Gynecology;  Laterality: Bilateral;  . TONSILLECTOMY     Medications: Reviewed & Updated - see associated section                       Current Outpatient Prescriptions:  .  HYDROcodone-acetaminophen (NORCO/VICODIN) 5-325 MG tablet, Take 1 tablet by mouth every 6 (six) hours as needed for moderate pain. May take with ibuprofen, Disp: 15 tablet, Rfl: 0 .  ramipril (ALTACE) 5 MG capsule, Take 1 capsule (5 mg total) by  mouth daily., Disp: 30 capsule, Rfl: 5 .  triamterene-hydrochlorothiazide (MAXZIDE) 75-50 MG tablet, Take 1 tablet by mouth daily., Disp: 30 tablet, Rfl: 6 .  butalbital-acetaminophen-caffeine (FIORICET/CODEINE) 50-325-40-30 MG capsule, Take 1 capsule by mouth every 4 (four) hours as needed for headache. (Patient not taking: Reported on 04/01/2016), Disp: 30 capsule, Rfl: 0 .  megestrol (MEGACE) 40 MG tablet, Take 1 tablet (40 mg total) by mouth 3 (three) times daily. (Patient not taking: Reported on 04/01/2016), Disp: 45 tablet, Rfl: 2   Social History: Reviewed -  reports that she has never smoked. She has never used smokeless tobacco.  Objective Findings:  Vitals: Blood pressure 118/78, pulse 96, weight 266 lb (120.7 kg).  Physical Examination: General appearance - alert, well appearing, and in no distress Mental status - alert, oriented to person, place, and time Abdomen - soft, nontender, nondistended, no masses or organomegaly Pelvic -  VULVA: normal appearing vulva with no masses, tenderness or lesions,  VAGINA: Heavy menstrual type flow; dark red, no clots    The provider spent over 25 minutes with the visit , including previsit review, and documentation,with >than 50% spent in discussing varying hysterectomy strategies and  coordination of care. Pt would like to have a supracervical hysterectomy.   Assessment & Plan:   A:  1. Menorrhagia  2, Failed IUD 3. Failed endometrial ablation  P:  1. Bleeding uncontrolled will stop megace and recommend hysterectomy  Risk benefit, rationale discussed; pt desires to proceed to hysterectomy with preservation of cervix.(SUPRACERVICAL HYSTERECTOMY Pt has already had bilateral salpingectomy  Pt will have preservation of ovaries. D/C MEGACE   By signing my name below, I, Evelene Croon, attest that this documentation has been prepared under the direction and in the presence of Jonnie Kind, MD . Electronically Signed: Evelene Croon,  Scribe. 04/01/2016. 2:05 PM. I personally performed the services described in this documentation, which was SCRIBED in my presence. The recorded information has been reviewed and considered accurate. It has been edited as necessary during review. Jonnie Kind, MD

## 2016-04-15 ENCOUNTER — Ambulatory Visit (INDEPENDENT_AMBULATORY_CARE_PROVIDER_SITE_OTHER): Payer: Medicaid Other | Admitting: Obstetrics and Gynecology

## 2016-04-15 ENCOUNTER — Encounter: Payer: Self-pay | Admitting: Obstetrics and Gynecology

## 2016-04-15 VITALS — BP 120/82 | HR 86 | Wt 272.0 lb

## 2016-04-15 DIAGNOSIS — D251 Intramural leiomyoma of uterus: Secondary | ICD-10-CM

## 2016-04-15 DIAGNOSIS — Z01818 Encounter for other preprocedural examination: Secondary | ICD-10-CM | POA: Diagnosis not present

## 2016-04-15 DIAGNOSIS — Z9889 Other specified postprocedural states: Secondary | ICD-10-CM | POA: Diagnosis not present

## 2016-04-15 NOTE — Progress Notes (Signed)
Preoperative History and Physical  Donna Vaughan is a 36 y.o. [id number] here for surgical management of menorrhagia and uterine fibroids s/p  endometrial ablation. No significant preoperative concerns. She denies any complaints such as fever, cough, rhinorrhea.  Proposed surgery: supracervical hysterectomy   Past Medical History:  Diagnosis Date  . Anemia   . Anxiety   . Depression   . Fibroids   . Headache   . Hypertension   . Morbid obesity ([place])   . Pregnant   . Vitamin D insufficiency    Past Surgical History:  Procedure Laterality Date  . ADENOIDECTOMY    . CHOLECYSTECTOMY    . DILITATION & CURRETTAGE/HYSTROSCOPY WITH NOVASURE ABLATION N/A 12/31/2015   Procedure: DILATATION & CURETTAGE/HYSTEROSCOPY WITH ENDOMETRIAL  ABLATION;  Surgeon: Jonnie Kind, MD;  Location: [facility];  Service: Gynecology;  Laterality: N/A;  . FOOT SURGERY Left    removal of foreign body  . LAPAROSCOPIC BILATERAL SALPINGECTOMY Bilateral 12/31/2015   Procedure: LAPAROSCOPIC BILATERAL SALPINGECTOMY;  Surgeon: Jonnie Kind, MD;  Location: [facility];  Service: Gynecology;  Laterality: Bilateral;  . TONSILLECTOMY     OB History  Gravida Para Term Preterm AB Living  5 4 3 1 1 3   SAB TAB Ectopic Multiple Live Births  1     0 3    # Outcome Date GA Lbr Len/2nd Weight Sex Delivery Anes PTL Lv  5 Preterm 10/30/15 [redacted]w[redacted]d / 00:02 9.5 oz (0.269 kg) M Vag-Spont EPI  FD  4 Term 12/04/13 [redacted]w[redacted]d  6 lb 11 oz (3.033 kg) M Vag-Spont EPI N LIV  3 SAB 01/2013     SAB   FD  2 Term 11/09/07 [redacted]w[redacted]d  6 lb 12 oz (3.062 kg) M Vag-Spont EPI N LIV  1 Term 02/10/99 [redacted]w[redacted]d  8 lb 3 oz (3.714 kg) M Vag-Spont  N LIV    Patient denies any other pertinent gynecologic issues.   Current Outpatient Prescriptions on File Prior to Visit  Medication Sig Dispense Refill  . ramipril (ALTACE) 5 MG capsule Take 1 capsule (5 mg total) by mouth daily. 30 capsule 5  . triamterene-hydrochlorothiazide (MAXZIDE) 75-50 MG tablet Take 1 tablet by  mouth daily. 30 tablet 6  . butalbital-acetaminophen-caffeine (FIORICET/CODEINE) 50-325-40-30 MG capsule Take 1 capsule by mouth every 4 (four) hours as needed for headache. (Patient not taking: Reported on 04/01/2016) 30 capsule 0  . megestrol (MEGACE) 40 MG tablet Take 1 tablet (40 mg total) by mouth 3 (three) times daily. (Patient not taking: Reported on 04/01/2016) 45 tablet 2   No current facility-administered medications on file prior to visit.    No Known Allergies  Social History:   reports that she has never smoked. She has never used smokeless tobacco. She reports that she does not drink alcohol or use drugs.  Family History  Problem Relation Age of Onset  . Hypertension Brother   . Asthma Son   . Diabetes Maternal Uncle   . Stroke Maternal Grandfather   . Hypertension Paternal Grandmother   . Heart disease Paternal Grandmother     CHF  . COPD Son     bronchitis    Review of Systems: Noncontributory  PHYSICAL EXAM: Blood pressure 120/82, pulse 86, weight 272 lb (123.4 kg). General appearance - alert, well appearing, and in no distress Chest - clear to auscultation, no wheezes, rales or rhonchi, symmetric air entry Heart - normal rate and regular rhythm Abdomen - soft, nontender, nondistended, no masses or organomegaly  Pelvic - examination external genitalia is normal Vagina: Heavy menstrual type flow; dark red, no clots, well supported.   Extremities - peripheral pulses normal, no pedal edema, no clubbing or cyanosis  Labs: No results found for this or any previous visit (from the past 336 hour(s)).  Imaging Studies: No results found.   Assessment: Patient Active Problem List   Diagnosis Date Noted  . Encounter for sterilization 12/31/2015  . Heavy periods 12/31/2015  . Prior pregnancy with fetal demise 11/12/2015  . Fibroid uterus 08/04/2014  . OBESITY 05/30/2007  . Chronic hypertension 05/30/2007    Plan: Patient will undergo  surgical management with supracervical hysterectomy.   04/15/2016 1:54 PM  By signing my name below, I, Sonum Patel, attest that this documentation has been prepared under the direction and in the presence of Jonnie Kind, MD. Electronically Signed: Sonum Patel, Education administrator. 04/15/16. 1:54 PM.  I personally performed the services described in this documentation, which was SCRIBED in my presence. The recorded information has been reviewed and considered accurate. It has been edited as necessary during review. Jonnie Kind, MD

## 2016-04-16 ENCOUNTER — Other Ambulatory Visit: Payer: Self-pay | Admitting: Obstetrics and Gynecology

## 2016-04-16 ENCOUNTER — Encounter (HOSPITAL_COMMUNITY): Admission: RE | Admit: 2016-04-16 | Payer: Medicaid Other | Source: Ambulatory Visit

## 2016-04-16 ENCOUNTER — Encounter (HOSPITAL_COMMUNITY)
Admission: RE | Admit: 2016-04-16 | Discharge: 2016-04-16 | Disposition: A | Discharge status: Home / Self Care | Payer: Medicaid Other | Source: Ambulatory Visit | Attending: Obstetrics and Gynecology | Admitting: Obstetrics and Gynecology

## 2016-04-16 ENCOUNTER — Encounter (HOSPITAL_COMMUNITY): Payer: Self-pay

## 2016-04-16 DIAGNOSIS — Z9889 Other specified postprocedural states: Secondary | ICD-10-CM | POA: Insufficient documentation

## 2016-04-16 DIAGNOSIS — D251 Intramural leiomyoma of uterus: Secondary | ICD-10-CM | POA: Diagnosis not present

## 2016-04-16 DIAGNOSIS — Z01818 Encounter for other preprocedural examination: Secondary | ICD-10-CM | POA: Insufficient documentation

## 2016-04-16 LAB — COMPREHENSIVE METABOLIC PANEL
ALT: 12 U/L — ABNORMAL LOW (ref 14–54)
AST: 15 U/L (ref 15–41)
Albumin: 3.6 g/dL (ref 3.5–5.0)
Alkaline Phosphatase: 66 U/L (ref 38–126)
Anion gap: 8 (ref 5–15)
BUN: 16 mg/dL (ref 6–20)
CO2: 26 mmol/L (ref 22–32)
Calcium: 8.9 mg/dL (ref 8.9–10.3)
Chloride: 104 mmol/L (ref 101–111)
Creatinine, Ser: 1.01 mg/dL — ABNORMAL HIGH (ref 0.44–1.00)
GFR calc Af Amer: >60 mL/min (ref >60)
GFR calc non Af Amer: >60 mL/min (ref >60)
Glucose, Bld: 82 mg/dL (ref 65–99)
Potassium: 4.6 mmol/L (ref 3.5–5.1)
Sodium: 138 mmol/L (ref 135–145)
Total Bilirubin: 0.4 mg/dL (ref 0.3–1.2)
Total Protein: 7.1 g/dL (ref 6.5–8.1)

## 2016-04-16 LAB — SURGICAL PCR SCREEN
MRSA, PCR: NEGATIVE
STAPHYLOCOCCUS AUREUS: NEGATIVE

## 2016-04-16 LAB — HCG, SERUM, QUALITATIVE: Preg, Serum: NEGATIVE

## 2016-04-16 LAB — CBC
HCT: 34.1 % — ABNORMAL LOW (ref 36.0–46.0)
Hemoglobin: 11.2 g/dL — ABNORMAL LOW (ref 12.0–15.0)
MCH: 28.6 pg (ref 26.0–34.0)
MCHC: 32.8 g/dL (ref 30.0–36.0)
MCV: 87.2 fL (ref 78.0–100.0)
Platelets: 339 10*3/uL (ref 150–400)
RBC: 3.91 MIL/uL (ref 3.87–5.11)
RDW: 13 % (ref 11.5–15.5)
WBC: 10.4 10*3/uL (ref 4.0–10.5)

## 2016-04-16 LAB — TYPE AND SCREEN
ABO/RH(D): O POS
Antibody Screen: NEGATIVE

## 2016-04-16 NOTE — Patient Instructions (Signed)
Donna Vaughan  04/16/2016     @PREFPERIOPPHARMACY @   Your procedure is scheduled on  04/21/2016   Report to [facility] at  [place]  A.M.  Call this number if you have problems the morning of surgery:  [phone]   Remember:  Do not eat food or drink liquids after midnight.  Take these medicines the morning of surgery with A SIP OF WATER None   Do not wear jewelry, make-up or nail polish.  Do not wear lotions, powders, or perfumes, or deoderant.  Do not shave 48 hours prior to surgery.  Men may shave face and neck.  Do not bring valuables to the hospital.  [facility] is not responsible for any belongings or valuables.  Contacts, dentures or bridgework may not be worn into surgery.  Leave your suitcase in the car.  After surgery it may be brought to your room.  For patients admitted to the hospital, discharge time will be determined by your treatment team.  Patients discharged the day of surgery will not be allowed to drive home.   Name and phone number of your driver:   family Special instructions:  Follow the diet and prep instructions given to you by Dr Glo Herring.  Please read over the following fact sheets that you were given. Anesthesia Post-op Instructions and Care and Recovery After Surgery      Supracervical Hysterectomy A supracervical hysterectomy is surgery to remove the top part of the uterus, but not the cervix. You will no longer have menstrual periods or be able to get pregnant after this surgery. The fallopian tubes and ovaries may also be removed (bilateral salpingo-oophorectomy) during this surgery. This surgery is usually performed using a minimally invasive technique called laparoscopy. This technique allows the surgery to be done through small incisions. The minimally invasive technique provides benefits such as less pain, less risk of infection, and shorter recovery time. Tell a health care provider about:  Any allergies you have.  All  medicines you are taking, including vitamins, herbs, eye drops, creams, and over-the-counter medicines.  Any problems you or family members have had with anesthetic medicines.  Any blood disorders you have.  Any surgeries you have had.  Any medical conditions you have. What are the risks? Generally, this is a safe procedure. However, as with any procedure, complications can occur. Possible complications include:  Bleeding.  Blood clots in the legs or lung.  Infection.  Injury to surrounding organs.  Problems related to anesthesia.  Conversion to an open abdominal surgery.  Additional surgery later to remove the cervix if you have problems with the cervix. What happens before the procedure?  Ask your health care provider about changing or stopping your regular medicines.  Do not take aspirin or blood thinners (anticoagulants) for 1 week before the surgery, or as directed by your health care provider.  Do not eat or drink anything for 8 hours before the surgery, or as directed by your health care provider.  Quit smoking if you smoke.  Arrange for a ride home after surgery and for someone to help you at home during recovery. What happens during the procedure?  You will be given an antibiotic medicine.  An IV tube will be placed in one of your veins. You will be given medicine to make you sleep (general anesthetic).  A gas (carbon dioxide) will be used to inflate your abdomen. This will allow your surgeon to look  inside your abdomen, perform your surgery, and treat any other problems found if necessary.  Three or four small incisions will be made in your abdomen. One of these incisions will be made in the area of your belly button (navel). A thin, flexible tube with a tiny camera and light on the end of it (laparoscope) will be inserted into the incision. The camera on the laparoscope sends a picture to a TV screen in the operating room. This gives your surgeon a good view  inside the abdomen.  Other surgical instruments will be inserted through the other incisions.  The uterus will be cut into small pieces and removed through the small incisions.  Your incisions will be closed. What happens after the procedure?  You will be taken to a recovery area where your progress will be monitored until you are awake, stable, and taking fluids well. If there are no other problems, you will then be moved to a regular hospital room, or you will be allowed to go home.  You will likely have minimal discomfort after the surgery because the incisions are so small with the laparoscopic technique.  You will be given pain medicine while you are in the hospital and for when you go home.  If a bilateral salpingo-oophorectomy was performed before menopause, you will go through a sudden (abrupt) menopause. This can be helped with hormone medicines. This information is not intended to replace advice given to you by your health care provider. Make sure you discuss any questions you have with your health care provider. Document Released: 06/17/2007 Document Revised: 06/06/2015 Document Reviewed: 07/01/2012 Elsevier Interactive Patient Education  2017 [place] Hysterectomy, Care After Refer to this sheet in the next few weeks. These instructions provide you with information on caring for yourself after your procedure. Your health care provider may also give you more specific instructions. Your treatment has been planned according to current medical practices, but problems sometimes occur. Call your health care provider if you have any problems or questions after your procedure. What can I expect after the procedure? After your procedure, it is typical to have some discomfort, tenderness, swelling, and bruising at the surgical sites. This normally lasts for about 2 weeks. Follow these instructions at home:  Get plenty of rest and sleep.  Only take over-the-counter or  prescription medicines as directed by your health care provider.  Do not take aspirin. It can cause bleeding.  Do not drive until your health care provider approves.  Follow your health care provider's advice regarding exercise, lifting, and general activities.  Resume your usual diet as directed by your health care provider.  Do not douche, use tampons, or have sexual intercourse for at least 6 weeks or until your health care provider gives you permission.  Change your bandages (dressings) only as directed by your health care provider.  Monitor your temperature.  Take showers instead of baths for 2-3 weeks or as directed by your health care provider.  Drink enough fluids to keep your urine clear or pale yellow.  Do not drink alcohol until your health care provider gives you permission.  If you are constipated, you may take a mild laxative if your health care provider approves. Bran foods may also help with constipation problems.  Try to have someone home with you for 1-2 weeks to help with activities.  Follow up with your health care provider as directed. Contact a health care provider if:  You have swelling, redness, or increasing pain  in the incision area.  You have pus coming from an incision.  You notice a bad smell coming from the incision or dressing.  You have swelling, redness, or pain in the area around the IV site.  Your incision breaks open.  You feel dizzy or lightheaded.  You have pain or bleeding when you urinate.  You have persistent diarrhea.  You have persistent nausea and vomiting.  You have abnormal vaginal discharge.  You have a rash.  Your pain is not controlled with your prescribed medicine. Get help right away if:  You have a fever.  You have severe abdominal pain.  You have chest pain.  You have shortness of breath.  You faint.  You have pain, swelling, or redness in your leg.  You have heavy vaginal bleeding with blood  clots. This information is not intended to replace advice given to you by your health care provider. Make sure you discuss any questions you have with your health care provider. Document Released: 10/19/2012 Document Revised: 06/06/2015 Document Reviewed: 07/01/2012 Elsevier Interactive Patient Education  2017 [place] Anesthesia, Adult General anesthesia is the use of medicines to make a person "go to sleep" (be unconscious) for a medical procedure. General anesthesia is often recommended when a procedure:  Is long.  Requires you to be still or in an unusual position.  Is major and can cause you to lose blood.  Is impossible to do without general anesthesia. The medicines used for general anesthesia are called general anesthetics. In addition to making you sleep, the medicines:  Prevent pain.  Control your blood pressure.  Relax your muscles. Tell a health care provider about:  Any allergies you have.  All medicines you are taking, including vitamins, herbs, eye drops, creams, and over-the-counter medicines.  Any problems you or family members have had with anesthetic medicines.  Types of anesthetics you have had in the past.  Any bleeding disorders you have.  Any surgeries you have had.  Any medical conditions you have.  Any history of heart or lung conditions, such as heart failure, sleep apnea, or chronic obstructive pulmonary disease (COPD).  Whether you are pregnant or may be pregnant.  Whether you use tobacco, alcohol, marijuana, or street drugs.  Any history of Armed forces logistics/support/administrative officer.  Any history of depression or anxiety. What are the risks? Generally, this is a safe procedure. However, problems may occur, including:  Allergic reaction to anesthetics.  Lung and heart problems.  Inhaling food or liquids from your stomach into your lungs (aspiration).  Injury to nerves.  Waking up during your procedure and being unable to move  (rare).  Extreme agitation or a state of mental confusion (delirium) when you wake up from the anesthetic.  Air in the bloodstream, which can lead to stroke. These problems are more likely to develop if you are having a major surgery or if you have an advanced medical condition. You can prevent some of these complications by answering all of your health care provider's questions thoroughly and by following all pre-procedure instructions. General anesthesia can cause side effects, including:  Nausea or vomiting  A sore throat from the breathing tube.  Feeling cold or shivery.  Feeling tired, washed out, or achy.  Sleepiness or drowsiness.  Confusion or agitation. What happens before the procedure? Staying hydrated  Follow instructions from your health care provider about hydration, which may include:  Up to 2 hours before the procedure - you may continue to drink clear liquids,  such as water, clear fruit juice, black coffee, and plain tea. Eating and drinking restrictions  Follow instructions from your health care provider about eating and drinking, which may include:  8 hours before the procedure - stop eating heavy meals or foods such as meat, fried foods, or fatty foods.  6 hours before the procedure - stop eating light meals or foods, such as toast or cereal.  6 hours before the procedure - stop drinking milk or drinks that contain milk.  2 hours before the procedure - stop drinking clear liquids. Medicines   Ask your health care provider about:  Changing or stopping your regular medicines. This is especially important if you are taking diabetes medicines or blood thinners.  Taking medicines such as aspirin and ibuprofen. These medicines can thin your blood. Do not take these medicines before your procedure if your health care provider instructs you not to.  Taking new dietary supplements or medicines. Do not take these during the week before your procedure unless your  health care provider approves them.  If you are told to take a medicine or to continue taking a medicine on the day of the procedure, take the medicine with sips of water. General instructions    Ask if you will be going home the same day, the following day, or after a longer hospital stay.  Plan to have someone take you home.  Plan to have someone stay with you for the first 24 hours after you leave the hospital or clinic.  For 3-6 weeks before the procedure, try not to use any tobacco products, such as cigarettes, chewing tobacco, and e-cigarettes.  You may brush your teeth on the morning of the procedure, but make sure to spit out the toothpaste. What happens during the procedure?  You will be given anesthetics through a mask and through an IV tube in one of your veins.  You may receive medicine to help you relax (sedative).  As soon as you are asleep, a breathing tube may be used to help you breathe.  An anesthesia specialist will stay with you throughout the procedure. He or she will help keep you comfortable and safe by continuing to give you medicines and adjusting the amount of medicine that you get. He or she will also watch your blood pressure, pulse, and oxygen levels to make sure that the anesthetics do not cause any problems.  If a breathing tube was used to help you breathe, it will be removed before you wake up. The procedure may vary among health care providers and hospitals. What happens after the procedure?  You will wake up, often slowly, after the procedure is complete, usually in a recovery area.  Your blood pressure, heart rate, breathing rate, and blood oxygen level will be monitored until the medicines you were given have worn off.  You may be given medicine to help you calm down if you feel anxious or agitated.  If you will be going home the same day, your health care provider may check to make sure you can stand, drink, and urinate.  Your health care  providers will treat your pain and side effects before you go home.  Do not drive for 24 hours if you received a sedative.  You may:  Feel nauseous and vomit.  Have a sore throat.  Have mental slowness.  Feel cold or shivery.  Feel sleepy.  Feel tired.  Feel sore or achy, even in parts of your body where you did  not have surgery. This information is not intended to replace advice given to you by your health care provider. Make sure you discuss any questions you have with your health care provider. Document Released: 04/07/2007 Document Revised: 06/11/2015 Document Reviewed: 12/13/2014 Elsevier Interactive Patient Education  2017 [place] Anesthesia, Adult, Care After These instructions provide you with information about caring for yourself after your procedure. Your health care provider may also give you more specific instructions. Your treatment has been planned according to current medical practices, but problems sometimes occur. Call your health care provider if you have any problems or questions after your procedure. What can I expect after the procedure? After the procedure, it is common to have:  Vomiting.  A sore throat.  Mental slowness. It is common to feel:  Nauseous.  Cold or shivery.  Sleepy.  Tired.  Sore or achy, even in parts of your body where you did not have surgery. Follow these instructions at home: For at least 24 hours after the procedure:   Do not:  Participate in activities where you could fall or become injured.  Drive.  Use heavy machinery.  Drink alcohol.  Take sleeping pills or medicines that cause drowsiness.  Make important decisions or sign legal documents.  Take care of children on your own.  Rest. Eating and drinking   If you vomit, drink water, juice, or soup when you can drink without vomiting.  Drink enough fluid to keep your urine clear or pale yellow.  Make sure you have little or no nausea before  eating solid foods.  Follow the diet recommended by your health care provider. General instructions   Have a responsible adult stay with you until you are awake and alert.  Return to your normal activities as told by your health care provider. Ask your health care provider what activities are safe for you.  Take over-the-counter and prescription medicines only as told by your health care provider.  If you smoke, do not smoke without supervision.  Keep all follow-up visits as told by your health care provider. This is important. Contact a health care provider if:  You continue to have nausea or vomiting at home, and medicines are not helpful.  You cannot drink fluids or start eating again.  You cannot urinate after 8-12 hours.  You develop a skin rash.  You have fever.  You have increasing redness at the site of your procedure. Get help right away if:  You have difficulty breathing.  You have chest pain.  You have unexpected bleeding.  You feel that you are having a life-threatening or urgent problem. This information is not intended to replace advice given to you by your health care provider. Make sure you discuss any questions you have with your health care provider. Document Released: 04/06/2000 Document Revised: 06/03/2015 Document Reviewed: 12/13/2014 Elsevier Interactive Patient Education  2017 [employer].

## 2016-04-17 LAB — GC/CHLAMYDIA PROBE AMP
CHLAMYDIA, DNA PROBE: NEGATIVE
NEISSERIA GONORRHOEAE BY PCR: NEGATIVE

## 2016-04-20 ENCOUNTER — Encounter (HOSPITAL_COMMUNITY): Payer: Self-pay | Admitting: Anesthesiology

## 2016-04-21 NOTE — Anesthesia Preprocedure Evaluation (Deleted)
Anesthesia Evaluation  Patient identified by MRN, date of birth, ID band Patient awake    Reviewed: Allergy & Precautions, NPO status , Patient's Chart, lab work & pertinent test results  Airway Mallampati: II  TM Distance: >3 FB Neck ROM: Full    Dental  (+) Teeth Intact   Pulmonary neg pulmonary ROS,    breath sounds clear to auscultation       Cardiovascular hypertension, Pt. on medications  Rhythm:Regular Rate:Normal     Neuro/Psych  Headaches, PSYCHIATRIC DISORDERS Anxiety Depression    GI/Hepatic negative GI ROS,   Endo/Other  Morbid obesity  Renal/GU      Musculoskeletal   Abdominal   Peds  Hematology  (+) anemia ,   Anesthesia Other Findings   Reproductive/Obstetrics                             Anesthesia Physical Anesthesia Plan  ASA: II  Anesthesia Plan: MAC   Post-op Pain Management:    Induction: Intravenous  Airway Management Planned: Simple Face Mask  Additional Equipment:   Intra-op Plan:   Post-operative Plan:   Informed Consent: I have reviewed the patients History and Physical, chart, labs and discussed the procedure including the risks, benefits and alternatives for the proposed anesthesia with the patient or authorized representative who has indicated his/her understanding and acceptance.     Plan Discussed with:   Anesthesia Plan Comments:         Anesthesia Quick Evaluation

## 2016-04-22 ENCOUNTER — Encounter: Payer: Self-pay | Admitting: Obstetrics and Gynecology

## 2016-04-23 ENCOUNTER — Telehealth: Payer: Self-pay | Admitting: *Deleted

## 2016-04-23 NOTE — Telephone Encounter (Signed)
Spoke with pt. Pt is scheduled for hyst May 1. She can't do it that day. Pt is starting a new job April 30. Pt would like surgery sooner if possible. Thanks!! [place]

## 2016-04-24 ENCOUNTER — Inpatient Hospital Stay (HOSPITAL_COMMUNITY): Admission: RE | Admit: 2016-04-24 | Payer: Medicaid Other | Source: Ambulatory Visit

## 2016-04-28 ENCOUNTER — Inpatient Hospital Stay (HOSPITAL_COMMUNITY)
Admission: RE | Admit: 2016-04-28 | Payer: Medicaid Other | Source: Ambulatory Visit | Admitting: Obstetrics and Gynecology

## 2016-04-28 ENCOUNTER — Encounter (HOSPITAL_COMMUNITY): Admission: RE | Payer: Self-pay | Source: Ambulatory Visit

## 2016-04-28 SURGERY — HYSTERECTOMY, SUPRACERVICAL, ABDOMINAL
Anesthesia: General

## 2016-04-28 NOTE — Telephone Encounter (Signed)
It is impossible to reschedule before I leave town 4/20. The patient is going to seek other provider to do surgery this month. She has signed release of records request. I am sorry her original scheduled surgery was cancelled by her, as we made extra effort to post the case ASAP and she cancelled the morning of surgery , did not show up.

## 2016-05-05 ENCOUNTER — Encounter: Payer: Self-pay | Admitting: Obstetrics and Gynecology

## 2016-05-06 ENCOUNTER — Telehealth: Payer: Self-pay | Admitting: Adult Health

## 2016-05-06 NOTE — Telephone Encounter (Signed)
I called pt and told her that Dr Glo Herring would not be in office til next week, call him then and try to set up appt, and possibly reschedule surgery.

## 2016-05-12 ENCOUNTER — Other Ambulatory Visit (HOSPITAL_COMMUNITY): Payer: Medicaid Other

## 2016-05-14 ENCOUNTER — Ambulatory Visit (INDEPENDENT_AMBULATORY_CARE_PROVIDER_SITE_OTHER): Payer: Medicaid Other | Admitting: Obstetrics and Gynecology

## 2016-05-14 ENCOUNTER — Encounter (HOSPITAL_COMMUNITY): Admission: RE | Payer: Self-pay | Source: Ambulatory Visit

## 2016-05-14 ENCOUNTER — Ambulatory Visit (HOSPITAL_COMMUNITY)
Admission: RE | Admit: 2016-05-14 | Payer: Medicaid Other | Source: Ambulatory Visit | Admitting: Obstetrics and Gynecology

## 2016-05-14 ENCOUNTER — Encounter: Payer: Self-pay | Admitting: Obstetrics and Gynecology

## 2016-05-14 VITALS — BP 130/82 | HR 93 | Ht 65.0 in | Wt 273.0 lb

## 2016-05-14 DIAGNOSIS — N920 Excessive and frequent menstruation with regular cycle: Secondary | ICD-10-CM

## 2016-05-14 SURGERY — HYSTERECTOMY, SUPRACERVICAL, ABDOMINAL
Anesthesia: General

## 2016-05-14 SURGERY — DILATATION & CURETTAGE/HYSTEROSCOPY WITH NOVASURE ABLATION
Anesthesia: General

## 2016-05-14 NOTE — Progress Notes (Signed)
Preoperative History and Physical  Donna Vaughan is a 36 y.o. [id number] here for surgical management of uterine fibroids, symptomatic with pain, and heavy bleeding that did not improve to pt satisfaction with endometrial ablation.  No significant preoperative concerns.  Proposed surgery: abdominal supracervical hysterectomy  Past Medical History:  Diagnosis Date  . Anemia   . Anxiety   . Depression   . Fibroids   . Headache   . Hypertension   . Morbid obesity ([place])   . Pregnant   . Vitamin D insufficiency    Past Surgical History:  Procedure Laterality Date  . ADENOIDECTOMY    . CHOLECYSTECTOMY    . DILITATION & CURRETTAGE/HYSTROSCOPY WITH NOVASURE ABLATION N/A 12/31/2015   Procedure: DILATATION & CURETTAGE/HYSTEROSCOPY WITH ENDOMETRIAL  ABLATION;  Surgeon: Jonnie Kind, MD;  Location: [facility];  Service: Gynecology;  Laterality: N/A;  . FOOT SURGERY Left    removal of foreign body  . LAPAROSCOPIC BILATERAL SALPINGECTOMY Bilateral 12/31/2015   Procedure: LAPAROSCOPIC BILATERAL SALPINGECTOMY;  Surgeon: Jonnie Kind, MD;  Location: [facility];  Service: Gynecology;  Laterality: Bilateral;  . TONSILLECTOMY     OB History  Gravida Para Term Preterm AB Living  5 4 3 1 1 3   SAB TAB Ectopic Multiple Live Births  1     0 3    # Outcome Date GA Lbr Len/2nd Weight Sex Delivery Anes PTL Lv  5 Preterm 10/30/15 [redacted]w[redacted]d / 00:02 9.5 oz (0.269 kg) M Vag-Spont EPI  FD  4 Term 12/04/13 [redacted]w[redacted]d  6 lb 11 oz (3.033 kg) M Vag-Spont EPI N LIV  3 SAB 01/2013     SAB   FD  2 Term 11/09/07 [redacted]w[redacted]d  6 lb 12 oz (3.062 kg) M Vag-Spont EPI N LIV  1 Term 02/10/99 [redacted]w[redacted]d  8 lb 3 oz (3.714 kg) M Vag-Spont  N LIV    Patient denies any other pertinent gynecologic issues.   Current Outpatient Prescriptions on File Prior to Visit  Medication Sig Dispense Refill  . acetaminophen (TYLENOL) 500 MG tablet Take 500 mg by mouth daily as needed for moderate pain or headache.    . diphenhydrAMINE (BENADRYL) 25 MG  tablet Take 25 mg by mouth daily as needed for allergies.    . fluticasone (FLONASE) 50 MCG/ACT nasal spray Place 1 spray into both nostrils daily as needed for allergies or rhinitis.    . ramipril (ALTACE) 5 MG capsule Take 1 capsule (5 mg total) by mouth daily. (Patient taking differently: Take 5 mg by mouth at bedtime. ) 30 capsule 5  . triamterene-hydrochlorothiazide (MAXZIDE) 75-50 MG tablet Take 1 tablet by mouth daily. (Patient taking differently: Take 1 tablet by mouth at bedtime. ) 30 tablet 6  . Aspirin-Salicylamide-Caffeine (BC HEADACHE PO) Take 1 packet by mouth daily as needed (headaches).     No current facility-administered medications on file prior to visit.    No Known Allergies  Social History:   reports that she has never smoked. She has never used smokeless tobacco. She reports that she does not drink alcohol or use drugs.  Family History  Problem Relation Age of Onset  . Hypertension Brother   . Asthma Son   . Diabetes Maternal Uncle   . Stroke Maternal Grandfather   . Hypertension Paternal Grandmother   . Heart disease Paternal Grandmother     CHF  . COPD Son     bronchitis    Review of Systems: Noncontributory  PHYSICAL EXAM: Blood pressure  130/82, pulse 93, height 5\' 5"  (1.651 m), weight 273 lb (123.8 kg). General appearance - alert, well appearing, and in no distress PERRL EOMI Alert, oriented,  Chest - clear to auscultation, no wheezes, rales or rhonchi, symmetric air entry Heart - normal rate and regular rhythm  Abdomen - soft, nontender, nondistended, no masses or organomegaly Obese, no masses or hernias. Well healed laparoscopy scars s/p salpingectomy for sterilization                      Pelvic - examination EFG normal             Vag: normal good support                     Pap normal by primary care w.in a year Extremities - peripheral pulses normal, no pedal edema, no clubbing or cyanosis  Labs: No results found for this or any previous  visit (from the past 336 hour(s)).  Imaging Studies: No results found.  Assessment: Patient Active Problem List   Diagnosis Date Noted  . Fibroid uterus 08/04/2014    Priority: Low  . S/P endometrial ablation 04/15/2016  . Encounter for sterilization 12/31/2015  . Menorrhagia 12/31/2015  . Prior pregnancy with fetal demise 11/12/2015  . OBESITY 05/30/2007  . Chronic hypertension 05/30/2007    Plan: Patient will undergo surgical management with abdominal supracervical hysterectomy. Probable date 15 May. Pt has had bilateral salpingectomy in past. Ovarian preservation planned Risks benefits, alternatives discussed, pt questions answered to pt satisfaction.Jonnie Kind, MD  05/14/2016 10:41 AM

## 2016-05-14 NOTE — Patient Instructions (Signed)
Abdominal Hysterectomy °Abdominal hysterectomy is a surgical procedure to remove the womb (uterus). The uterus is the muscular organ that houses a developing baby. This surgery may be done if: °· You have cancer. °· You have growths (tumors or fibroids) in the uterus. °· You have long-term (chronic) pain. °· You are bleeding. °· Your uterus has slipped down into your vagina (uterine prolapse). °· You have a condition in which the tissue that lines the uterus grows outside of its normal location (endometriosis). °· You have an infection in your uterus. °· You are having problems with your menstrual cycle. ° °Depending on why you are having this procedure, you may also have other reproductive organs removed. These could include: °· The part of your vagina that connects with your uterus (cervix). °· The organs that make eggs (ovaries). °· The tubes that connect the ovaries to the uterus (fallopian tubes). ° °Tell a health care provider about: °· Any allergies you have. °· All medicines you are taking, including vitamins, herbs, eye drops, creams, and over-the-counter medicines. °· Any problems you or family members have had with anesthetic medicines. °· Any blood disorders you have. °· Any surgeries you have had. °· Any medical conditions you have. °· Whether you are pregnant or may be pregnant. °What are the risks? °Generally, this is a safe procedure. However, problems may occur, including: °· Bleeding. °· Infection. °· Allergic reactions to medicines or dyes. °· Damage to other structures or organs. °· Nerve injury. °· Decreased interest in sex or pain during sex. °· Blood clots that can break free and travel to your lungs. ° °What happens before the procedure? °Staying hydrated °Follow instructions from your health care provider about hydration, which may include: °· Up to 2 hours before the procedure - you may continue to drink clear liquids, such as water, clear fruit juice, black coffee, and plain tea ° °Eating  and drinking restrictions °Follow instructions from your health care provider about eating and drinking, which may include: °· 8 hours before the procedure - stop eating heavy meals or foods such as meat, fried foods, or fatty foods. °· 6 hours before the procedure - stop eating light meals or foods, such as toast or cereal. °· 6 hours before the procedure - stop drinking milk or drinks that contain milk. °· 2 hours before the procedure - stop drinking clear liquids. ° °Medicines °· Ask your health care provider about: °? Changing or stopping your regular medicines. This is especially important if you are taking diabetes medicines or blood thinners. °? Taking medicines such as aspirin and ibuprofen. These medicines can thin your blood. Do not take these medicines before your procedure if your health care provider instructs you not to. °· You may be given antibiotic medicine to help prevent infection. Take it as told by your health care provider. °· You may be asked to take laxatives to prevent constipation. °General instructions °· Ask your health care provider how your surgical site will be marked or identified. °· You may be asked to shower with a germ-killing soap. °· Plan to have someone take you home from the hospital. °· Do not use any products that contain nicotine or tobacco, such as cigarettes and e-cigarettes. If you need help quitting, ask your health care provider. °· You may have an exam or testing. °· You may have a blood or urine sample taken. °· You may need to have an enema to clean out your rectum and lower colon. °· This   procedure can affect the way you feel about yourself. Talk to your health care provider about the physical and emotional changes this procedure may cause. °What happens during the procedure? °· To lower your risk of infection: °? Your health care team will wash or sanitize their hands. °? Your skin will be washed with soap. °? Hair may be removed from the surgical area. °· An IV  tube will be inserted into one of your veins. °· You will be given one or more of the following: °? A medicine to help you relax (sedative). °? A medicine to make you fall asleep (general anesthetic). °· Tight-fitting (compression) stockings will be placed on your legs to promote circulation. °· A thin, flexible tube (catheter) will be inserted to help drain your urine. °· The surgeon will make a cut (incision) through the skin in your lower belly. The incision may go side-to-side or up-and-down. °· The surgeon will move aside the body tissue that covers your uterus. The surgeon will then carefully take out your uterus along with any of the other organs that need to be removed. °· Bleeding will be controlled with clamps or sutures. °· The surgeon will close your incision with stitches (sutures), skin glue, or adhesive strips. °· A bandage (dressing) will be placed over the incision. °The procedure may vary among health care providers and hospitals. °What happens after the procedure? °· You will be given pain medicine as needed. °· Your blood pressure, heart rate, breathing rate, and blood oxygen level will be monitored until the medicines you were given have worn off. °· You will need to stay in the hospital to recover for one to two days. Ask your health care provider how long you will need to stay in the hospital after your procedure. °· You may have a liquid diet at first. You will most likely return to your usual diet the day after surgery. °· You will still have the urinary catheter in place. It will likely be removed the day after surgery. °· You may have to wear compression stockings. These stockings help to prevent blood clots and reduce swelling in your legs. °· You will be encouraged to walk as soon as possible. You will also use a device or do breathing exercises to keep your lungs clear. °· You may need to use a sanitary napkin for vaginal discharge. °Summary °· Abdominal hysterectomy is a surgical  procedure to remove the womb (uterus). The uterus is the muscular organ that houses a developing baby. °· This procedure can affect the way you feel about yourself. Talk to your health care provider about the physical and emotional changes this procedure may cause. °· You will be given medicines for pain after the procedure. °· You will need to stay in the hospital to recover. Ask your health care provider how long you will need to stay in the hospital after your procedure. °This information is not intended to replace advice given to you by your health care provider. Make sure you discuss any questions you have with your health care provider. °Document Released: 01/03/2013 Document Revised: 12/18/2015 Document Reviewed: 12/18/2015 °[employer] Interactive Patient Education © 2017 [employer] ° °

## 2016-05-20 NOTE — Patient Instructions (Signed)
Donna Vaughan  05/20/2016     @PREFPERIOPPHARMACY @   Your procedure is scheduled on  05/26/2016.  Report to [facility] at  930  A.M.  Call this number if you have problems the morning of surgery:  [phone]   Remember:  Do not eat food or drink liquids after midnight.  Take these medicines the morning of surgery with A SIP OF WATER  altace, maxzide.   Do not wear jewelry, make-up or nail polish.  Do not wear lotions, powders, or perfumes, or deoderant.  Do not shave 48 hours prior to surgery.  Men may shave face and neck.  Do not bring valuables to the hospital.  [facility] is not responsible for any belongings or valuables.  Contacts, dentures or bridgework may not be worn into surgery.  Leave your suitcase in the car.  After surgery it may be brought to your room.  For patients admitted to the hospital, discharge time will be determined by your treatment team.  Patients discharged the day of surgery will not be allowed to drive home.   Name and phone number of your driver:   family Special instructions:  Follow the diet and prep instructions given to you by Dr Johnnye Sima office.  Please read over the following fact sheets that you were given. Pain Booklet, Coughing and Deep Breathing, Blood Transfusion Information, Lab Information, MRSA Information, Surgical Site Infection Prevention, Anesthesia Post-op Instructions and Care and Recovery After Surgery      Supracervical Hysterectomy A supracervical hysterectomy is surgery to remove the top part of the uterus, but not the cervix. You will no longer have menstrual periods or be able to get pregnant after this surgery. The fallopian tubes and ovaries may also be removed (bilateral salpingo-oophorectomy) during this surgery. This surgery is usually performed using a minimally invasive technique called laparoscopy. This technique allows the surgery to be done through small incisions. The minimally invasive  technique provides benefits such as less pain, less risk of infection, and shorter recovery time. Tell a health care provider about:  Any allergies you have.  All medicines you are taking, including vitamins, herbs, eye drops, creams, and over-the-counter medicines.  Any problems you or family members have had with anesthetic medicines.  Any blood disorders you have.  Any surgeries you have had.  Any medical conditions you have. What are the risks? Generally, this is a safe procedure. However, as with any procedure, complications can occur. Possible complications include:  Bleeding.  Blood clots in the legs or lung.  Infection.  Injury to surrounding organs.  Problems related to anesthesia.  Conversion to an open abdominal surgery.  Additional surgery later to remove the cervix if you have problems with the cervix. What happens before the procedure?  Ask your health care provider about changing or stopping your regular medicines.  Do not take aspirin or blood thinners (anticoagulants) for 1 week before the surgery, or as directed by your health care provider.  Do not eat or drink anything for 8 hours before the surgery, or as directed by your health care provider.  Quit smoking if you smoke.  Arrange for a ride home after surgery and for someone to help you at home during recovery. What happens during the procedure?  You will be given an antibiotic medicine.  An IV tube will be placed in one of your veins. You will be given medicine to make you sleep (general anesthetic).  A gas (carbon dioxide) will be used to inflate your abdomen. This will allow your surgeon to look inside your abdomen, perform your surgery, and treat any other problems found if necessary.  Three or four small incisions will be made in your abdomen. One of these incisions will be made in the area of your belly button (navel). A thin, flexible tube with a tiny camera and light on the end of it  (laparoscope) will be inserted into the incision. The camera on the laparoscope sends a picture to a TV screen in the operating room. This gives your surgeon a good view inside the abdomen.  Other surgical instruments will be inserted through the other incisions.  The uterus will be cut into small pieces and removed through the small incisions.  Your incisions will be closed. What happens after the procedure?  You will be taken to a recovery area where your progress will be monitored until you are awake, stable, and taking fluids well. If there are no other problems, you will then be moved to a regular hospital room, or you will be allowed to go home.  You will likely have minimal discomfort after the surgery because the incisions are so small with the laparoscopic technique.  You will be given pain medicine while you are in the hospital and for when you go home.  If a bilateral salpingo-oophorectomy was performed before menopause, you will go through a sudden (abrupt) menopause. This can be helped with hormone medicines. This information is not intended to replace advice given to you by your health care provider. Make sure you discuss any questions you have with your health care provider. Document Released: 06/17/2007 Document Revised: 06/06/2015 Document Reviewed: 07/01/2012 Elsevier Interactive Patient Education  2017 [place] Hysterectomy, Care After Refer to this sheet in the next few weeks. These instructions provide you with information on caring for yourself after your procedure. Your health care provider may also give you more specific instructions. Your treatment has been planned according to current medical practices, but problems sometimes occur. Call your health care provider if you have any problems or questions after your procedure. What can I expect after the procedure? After your procedure, it is typical to have some discomfort, tenderness, swelling, and  bruising at the surgical sites. This normally lasts for about 2 weeks. Follow these instructions at home:  Get plenty of rest and sleep.  Only take over-the-counter or prescription medicines as directed by your health care provider.  Do not take aspirin. It can cause bleeding.  Do not drive until your health care provider approves.  Follow your health care provider's advice regarding exercise, lifting, and general activities.  Resume your usual diet as directed by your health care provider.  Do not douche, use tampons, or have sexual intercourse for at least 6 weeks or until your health care provider gives you permission.  Change your bandages (dressings) only as directed by your health care provider.  Monitor your temperature.  Take showers instead of baths for 2-3 weeks or as directed by your health care provider.  Drink enough fluids to keep your urine clear or pale yellow.  Do not drink alcohol until your health care provider gives you permission.  If you are constipated, you may take a mild laxative if your health care provider approves. Bran foods may also help with constipation problems.  Try to have someone home with you for 1-2 weeks to help with activities.  Follow up with your health  care provider as directed. Contact a health care provider if:  You have swelling, redness, or increasing pain in the incision area.  You have pus coming from an incision.  You notice a bad smell coming from the incision or dressing.  You have swelling, redness, or pain in the area around the IV site.  Your incision breaks open.  You feel dizzy or lightheaded.  You have pain or bleeding when you urinate.  You have persistent diarrhea.  You have persistent nausea and vomiting.  You have abnormal vaginal discharge.  You have a rash.  Your pain is not controlled with your prescribed medicine. Get help right away if:  You have a fever.  You have severe abdominal  pain.  You have chest pain.  You have shortness of breath.  You faint.  You have pain, swelling, or redness in your leg.  You have heavy vaginal bleeding with blood clots. This information is not intended to replace advice given to you by your health care provider. Make sure you discuss any questions you have with your health care provider. Document Released: 10/19/2012 Document Revised: 06/06/2015 Document Reviewed: 07/01/2012 Elsevier Interactive Patient Education  2017 [place] Anesthesia, Adult General anesthesia is the use of medicines to make a person "go to sleep" (be unconscious) for a medical procedure. General anesthesia is often recommended when a procedure:  Is long.  Requires you to be still or in an unusual position.  Is major and can cause you to lose blood.  Is impossible to do without general anesthesia. The medicines used for general anesthesia are called general anesthetics. In addition to making you sleep, the medicines:  Prevent pain.  Control your blood pressure.  Relax your muscles. Tell a health care provider about:  Any allergies you have.  All medicines you are taking, including vitamins, herbs, eye drops, creams, and over-the-counter medicines.  Any problems you or family members have had with anesthetic medicines.  Types of anesthetics you have had in the past.  Any bleeding disorders you have.  Any surgeries you have had.  Any medical conditions you have.  Any history of heart or lung conditions, such as heart failure, sleep apnea, or chronic obstructive pulmonary disease (COPD).  Whether you are pregnant or may be pregnant.  Whether you use tobacco, alcohol, marijuana, or street drugs.  Any history of Armed forces logistics/support/administrative officer.  Any history of depression or anxiety. What are the risks? Generally, this is a safe procedure. However, problems may occur, including:  Allergic reaction to anesthetics.  Lung and heart  problems.  Inhaling food or liquids from your stomach into your lungs (aspiration).  Injury to nerves.  Waking up during your procedure and being unable to move (rare).  Extreme agitation or a state of mental confusion (delirium) when you wake up from the anesthetic.  Air in the bloodstream, which can lead to stroke. These problems are more likely to develop if you are having a major surgery or if you have an advanced medical condition. You can prevent some of these complications by answering all of your health care provider's questions thoroughly and by following all pre-procedure instructions. General anesthesia can cause side effects, including:  Nausea or vomiting  A sore throat from the breathing tube.  Feeling cold or shivery.  Feeling tired, washed out, or achy.  Sleepiness or drowsiness.  Confusion or agitation. What happens before the procedure? Staying hydrated  Follow instructions from your health care provider about hydration, which  may include:  Up to 2 hours before the procedure - you may continue to drink clear liquids, such as water, clear fruit juice, black coffee, and plain tea. Eating and drinking restrictions  Follow instructions from your health care provider about eating and drinking, which may include:  8 hours before the procedure - stop eating heavy meals or foods such as meat, fried foods, or fatty foods.  6 hours before the procedure - stop eating light meals or foods, such as toast or cereal.  6 hours before the procedure - stop drinking milk or drinks that contain milk.  2 hours before the procedure - stop drinking clear liquids. Medicines   Ask your health care provider about:  Changing or stopping your regular medicines. This is especially important if you are taking diabetes medicines or blood thinners.  Taking medicines such as aspirin and ibuprofen. These medicines can thin your blood. Do not take these medicines before your procedure if  your health care provider instructs you not to.  Taking new dietary supplements or medicines. Do not take these during the week before your procedure unless your health care provider approves them.  If you are told to take a medicine or to continue taking a medicine on the day of the procedure, take the medicine with sips of water. General instructions    Ask if you will be going home the same day, the following day, or after a longer hospital stay.  Plan to have someone take you home.  Plan to have someone stay with you for the first 24 hours after you leave the hospital or clinic.  For 3-6 weeks before the procedure, try not to use any tobacco products, such as cigarettes, chewing tobacco, and e-cigarettes.  You may brush your teeth on the morning of the procedure, but make sure to spit out the toothpaste. What happens during the procedure?  You will be given anesthetics through a mask and through an IV tube in one of your veins.  You may receive medicine to help you relax (sedative).  As soon as you are asleep, a breathing tube may be used to help you breathe.  An anesthesia specialist will stay with you throughout the procedure. He or she will help keep you comfortable and safe by continuing to give you medicines and adjusting the amount of medicine that you get. He or she will also watch your blood pressure, pulse, and oxygen levels to make sure that the anesthetics do not cause any problems.  If a breathing tube was used to help you breathe, it will be removed before you wake up. The procedure may vary among health care providers and hospitals. What happens after the procedure?  You will wake up, often slowly, after the procedure is complete, usually in a recovery area.  Your blood pressure, heart rate, breathing rate, and blood oxygen level will be monitored until the medicines you were given have worn off.  You may be given medicine to help you calm down if you feel anxious  or agitated.  If you will be going home the same day, your health care provider may check to make sure you can stand, drink, and urinate.  Your health care providers will treat your pain and side effects before you go home.  Do not drive for 24 hours if you received a sedative.  You may:  Feel nauseous and vomit.  Have a sore throat.  Have mental slowness.  Feel cold or shivery.  Feel  sleepy.  Feel tired.  Feel sore or achy, even in parts of your body where you did not have surgery. This information is not intended to replace advice given to you by your health care provider. Make sure you discuss any questions you have with your health care provider. Document Released: 04/07/2007 Document Revised: 06/11/2015 Document Reviewed: 12/13/2014 Elsevier Interactive Patient Education  2017 [place] Anesthesia, Adult, Care After These instructions provide you with information about caring for yourself after your procedure. Your health care provider may also give you more specific instructions. Your treatment has been planned according to current medical practices, but problems sometimes occur. Call your health care provider if you have any problems or questions after your procedure. What can I expect after the procedure? After the procedure, it is common to have:  Vomiting.  A sore throat.  Mental slowness. It is common to feel:  Nauseous.  Cold or shivery.  Sleepy.  Tired.  Sore or achy, even in parts of your body where you did not have surgery. Follow these instructions at home: For at least 24 hours after the procedure:   Do not:  Participate in activities where you could fall or become injured.  Drive.  Use heavy machinery.  Drink alcohol.  Take sleeping pills or medicines that cause drowsiness.  Make important decisions or sign legal documents.  Take care of children on your own.  Rest. Eating and drinking   If you vomit, drink water,  juice, or soup when you can drink without vomiting.  Drink enough fluid to keep your urine clear or pale yellow.  Make sure you have little or no nausea before eating solid foods.  Follow the diet recommended by your health care provider. General instructions   Have a responsible adult stay with you until you are awake and alert.  Return to your normal activities as told by your health care provider. Ask your health care provider what activities are safe for you.  Take over-the-counter and prescription medicines only as told by your health care provider.  If you smoke, do not smoke without supervision.  Keep all follow-up visits as told by your health care provider. This is important. Contact a health care provider if:  You continue to have nausea or vomiting at home, and medicines are not helpful.  You cannot drink fluids or start eating again.  You cannot urinate after 8-12 hours.  You develop a skin rash.  You have fever.  You have increasing redness at the site of your procedure. Get help right away if:  You have difficulty breathing.  You have chest pain.  You have unexpected bleeding.  You feel that you are having a life-threatening or urgent problem. This information is not intended to replace advice given to you by your health care provider. Make sure you discuss any questions you have with your health care provider. Document Released: 04/06/2000 Document Revised: 06/03/2015 Document Reviewed: 12/13/2014 Elsevier Interactive Patient Education  2017 [employer].

## 2016-05-21 ENCOUNTER — Other Ambulatory Visit: Payer: Self-pay | Admitting: Obstetrics & Gynecology

## 2016-05-21 ENCOUNTER — Encounter (HOSPITAL_COMMUNITY)
Admission: RE | Admit: 2016-05-21 | Discharge: 2016-05-21 | Disposition: A | Discharge status: Home / Self Care | Payer: Medicaid Other | Source: Ambulatory Visit | Attending: Obstetrics and Gynecology | Admitting: Obstetrics and Gynecology

## 2016-05-21 ENCOUNTER — Encounter (HOSPITAL_COMMUNITY): Payer: Self-pay

## 2016-05-21 DIAGNOSIS — Z01812 Encounter for preprocedural laboratory examination: Secondary | ICD-10-CM | POA: Insufficient documentation

## 2016-05-21 DIAGNOSIS — Z0183 Encounter for blood typing: Secondary | ICD-10-CM | POA: Insufficient documentation

## 2016-05-21 DIAGNOSIS — D259 Leiomyoma of uterus, unspecified: Secondary | ICD-10-CM | POA: Diagnosis not present

## 2016-05-21 LAB — COMPREHENSIVE METABOLIC PANEL
ALBUMIN: 3.6 g/dL (ref 3.5–5.0)
ALK PHOS: 65 U/L (ref 38–126)
ALT: 10 U/L — ABNORMAL LOW (ref 14–54)
AST: 13 U/L — ABNORMAL LOW (ref 15–41)
Anion gap: 6 (ref 5–15)
BILIRUBIN TOTAL: 0.2 mg/dL — AB (ref 0.3–1.2)
BUN: 13 mg/dL (ref 6–20)
CALCIUM: 8.9 mg/dL (ref 8.9–10.3)
CO2: 24 mmol/L (ref 22–32)
Chloride: 109 mmol/L (ref 101–111)
Creatinine, Ser: 0.86 mg/dL (ref 0.44–1.00)
GFR calc Af Amer: >60 mL/min (ref >60)
GFR calc non Af Amer: >60 mL/min (ref >60)
GLUCOSE: 89 mg/dL (ref 65–99)
Potassium: 4 mmol/L (ref 3.5–5.1)
Sodium: 139 mmol/L (ref 135–145)
TOTAL PROTEIN: 7.2 g/dL (ref 6.5–8.1)

## 2016-05-21 LAB — SURGICAL PCR SCREEN
MRSA, PCR: NEGATIVE
STAPHYLOCOCCUS AUREUS: NEGATIVE

## 2016-05-21 LAB — URINALYSIS, ROUTINE W REFLEX MICROSCOPIC
Bilirubin Urine: NEGATIVE
Glucose, UA: NEGATIVE mg/dL
Ketones, ur: NEGATIVE mg/dL
NITRITE: NEGATIVE
PH: 6 (ref 5.0–8.0)
Protein, ur: 30 mg/dL — AB
SPECIFIC GRAVITY, URINE: 1.017 (ref 1.005–1.030)

## 2016-05-21 LAB — RAPID HIV SCREEN (HIV 1/2 AB+AG)
HIV 1/2 ANTIBODIES: NONREACTIVE
HIV-1 P24 ANTIGEN - HIV24: NONREACTIVE

## 2016-05-21 LAB — CBC
HEMATOCRIT: 33.1 % — AB (ref 36.0–46.0)
HEMOGLOBIN: 10.9 g/dL — AB (ref 12.0–15.0)
MCH: 28.1 pg (ref 26.0–34.0)
MCHC: 32.9 g/dL (ref 30.0–36.0)
MCV: 85.3 fL (ref 78.0–100.0)
Platelets: 337 10*3/uL (ref 150–400)
RBC: 3.88 MIL/uL (ref 3.87–5.11)
RDW: 13.5 % (ref 11.5–15.5)
WBC: 9.4 10*3/uL (ref 4.0–10.5)

## 2016-05-21 LAB — TYPE AND SCREEN
ABO/RH(D): O POS
ANTIBODY SCREEN: NEGATIVE

## 2016-05-21 LAB — HCG, QUANTITATIVE, PREGNANCY: hCG, Beta Chain, Quant, S: <1 m[IU]/mL (ref <5)

## 2016-05-22 NOTE — Pre-Procedure Instructions (Signed)
Dr Patsey Berthold aware of Hgb 10.9 and type and screened. No orders given.

## 2016-05-26 ENCOUNTER — Inpatient Hospital Stay (HOSPITAL_COMMUNITY)
Admission: RE | Admit: 2016-05-26 | Discharge: 2016-05-27 | DRG: 742 | Disposition: A | Discharge status: Home / Self Care | Payer: Medicaid Other | Source: Ambulatory Visit | Attending: Obstetrics and Gynecology | Admitting: Obstetrics and Gynecology

## 2016-05-26 ENCOUNTER — Inpatient Hospital Stay (HOSPITAL_COMMUNITY): Payer: Medicaid Other | Admitting: Anesthesiology

## 2016-05-26 ENCOUNTER — Encounter (HOSPITAL_COMMUNITY)
Admission: RE | Disposition: A | Discharge status: Home / Self Care | Payer: Self-pay | Source: Ambulatory Visit | Attending: Obstetrics and Gynecology

## 2016-05-26 ENCOUNTER — Encounter (HOSPITAL_COMMUNITY): Payer: Self-pay | Admitting: *Deleted

## 2016-05-26 DIAGNOSIS — Z8249 Family history of ischemic heart disease and other diseases of the circulatory system: Secondary | ICD-10-CM | POA: Diagnosis not present

## 2016-05-26 DIAGNOSIS — Z7951 Long term (current) use of inhaled steroids: Secondary | ICD-10-CM | POA: Diagnosis not present

## 2016-05-26 DIAGNOSIS — E559 Vitamin D deficiency, unspecified: Secondary | ICD-10-CM | POA: Diagnosis present

## 2016-05-26 DIAGNOSIS — Z833 Family history of diabetes mellitus: Secondary | ICD-10-CM | POA: Diagnosis not present

## 2016-05-26 DIAGNOSIS — Z6841 Body Mass Index (BMI) 40.0 and over, adult: Secondary | ICD-10-CM

## 2016-05-26 DIAGNOSIS — Z9049 Acquired absence of other specified parts of digestive tract: Secondary | ICD-10-CM

## 2016-05-26 DIAGNOSIS — N920 Excessive and frequent menstruation with regular cycle: Secondary | ICD-10-CM | POA: Diagnosis present

## 2016-05-26 DIAGNOSIS — Z823 Family history of stroke: Secondary | ICD-10-CM

## 2016-05-26 DIAGNOSIS — I10 Essential (primary) hypertension: Secondary | ICD-10-CM | POA: Diagnosis present

## 2016-05-26 DIAGNOSIS — D259 Leiomyoma of uterus, unspecified: Secondary | ICD-10-CM | POA: Diagnosis present

## 2016-05-26 DIAGNOSIS — Z90711 Acquired absence of uterus with remaining cervical stump: Secondary | ICD-10-CM

## 2016-05-26 DIAGNOSIS — Z825 Family history of asthma and other chronic lower respiratory diseases: Secondary | ICD-10-CM | POA: Diagnosis not present

## 2016-05-26 DIAGNOSIS — D251 Intramural leiomyoma of uterus: Secondary | ICD-10-CM

## 2016-05-26 HISTORY — PX: SUPRACERVICAL ABDOMINAL HYSTERECTOMY: SHX5393

## 2016-05-26 SURGERY — HYSTERECTOMY, SUPRACERVICAL, ABDOMINAL
Anesthesia: General | Site: Uterus

## 2016-05-26 MED ORDER — PROPOFOL 10 MG/ML IV BOLUS
INTRAVENOUS | Status: AC
  Filled 2016-05-26: qty 20

## 2016-05-26 MED ORDER — CEFAZOLIN SODIUM-DEXTROSE 2-4 GM/100ML-% IV SOLN
2.0000 g | INTRAVENOUS | Status: AC
  Administered 2016-05-26: 2 g via INTRAVENOUS
  Filled 2016-05-26: qty 100

## 2016-05-26 MED ORDER — NEOSTIGMINE METHYLSULFATE 10 MG/10ML IV SOLN
INTRAVENOUS | Status: DC | PRN
  Administered 2016-05-26: 4 mg via INTRAVENOUS

## 2016-05-26 MED ORDER — NALOXONE HCL 0.4 MG/ML IJ SOLN: 0.4000 mg | INTRAMUSCULAR | Status: DC | PRN

## 2016-05-26 MED ORDER — KETOROLAC TROMETHAMINE 30 MG/ML IJ SOLN
30.0000 mg | Freq: Once | INTRAMUSCULAR | Status: AC
  Administered 2016-05-26: 30 mg via INTRAVENOUS

## 2016-05-26 MED ORDER — BUPIVACAINE LIPOSOME 1.3 % IJ SUSP
20.0000 mL | Freq: Once | INTRAMUSCULAR | Status: DC
  Filled 2016-05-26: qty 20

## 2016-05-26 MED ORDER — SODIUM CHLORIDE 0.9 % IV SOLN
INTRAVENOUS | Status: DC
  Administered 2016-05-26 – 2016-05-27 (×3): via INTRAVENOUS

## 2016-05-26 MED ORDER — BUPIVACAINE HCL (PF) 0.5 % IJ SOLN
INTRAMUSCULAR | Status: AC
  Filled 2016-05-26: qty 30

## 2016-05-26 MED ORDER — CEFAZOLIN SODIUM-DEXTROSE 2-4 GM/100ML-% IV SOLN: 2.0000 g | INTRAVENOUS | Status: DC

## 2016-05-26 MED ORDER — MIDAZOLAM HCL 2 MG/2ML IJ SOLN
1.0000 mg | INTRAMUSCULAR | Status: DC
  Administered 2016-05-26: 2 mg via INTRAVENOUS

## 2016-05-26 MED ORDER — ROCURONIUM BROMIDE 50 MG/5ML IV SOLN
INTRAVENOUS | Status: AC
  Filled 2016-05-26: qty 1

## 2016-05-26 MED ORDER — MIDAZOLAM HCL 2 MG/2ML IJ SOLN
INTRAMUSCULAR | Status: AC
  Filled 2016-05-26: qty 2

## 2016-05-26 MED ORDER — PROMETHAZINE HCL 25 MG/ML IJ SOLN
12.5000 mg | INTRAMUSCULAR | Status: DC | PRN
  Administered 2016-05-26: 12.5 mg via INTRAVENOUS
  Filled 2016-05-26: qty 1

## 2016-05-26 MED ORDER — FENTANYL CITRATE (PF) 100 MCG/2ML IJ SOLN
INTRAMUSCULAR | Status: DC | PRN
  Administered 2016-05-26: 25 ug via INTRAVENOUS
  Administered 2016-05-26: 50 ug via INTRAVENOUS
  Administered 2016-05-26: 100 ug via INTRAVENOUS
  Administered 2016-05-26: 50 ug via INTRAVENOUS
  Administered 2016-05-26: 25 ug via INTRAVENOUS
  Administered 2016-05-26 (×2): 100 ug via INTRAVENOUS
  Administered 2016-05-26 (×2): 25 ug via INTRAVENOUS

## 2016-05-26 MED ORDER — TRIAMTERENE-HCTZ 75-50 MG PO TABS
1.0000 | ORAL_TABLET | Freq: Every day | ORAL | Status: DC
  Administered 2016-05-27: 1 via ORAL
  Filled 2016-05-26: qty 1

## 2016-05-26 MED ORDER — RAMIPRIL 5 MG PO CAPS
5.0000 mg | ORAL_CAPSULE | Freq: Every day | ORAL | Status: DC
  Administered 2016-05-26 – 2016-05-27 (×2): 5 mg via ORAL
  Filled 2016-05-26 (×2): qty 1

## 2016-05-26 MED ORDER — PHENYLEPHRINE HCL 10 MG/ML IJ SOLN
INTRAMUSCULAR | Status: DC | PRN
  Administered 2016-05-26: 100 ug via INTRAVENOUS
  Administered 2016-05-26: 80 ug via INTRAVENOUS
  Administered 2016-05-26 (×2): 40 ug via INTRAVENOUS
  Administered 2016-05-26: 100 ug via INTRAVENOUS

## 2016-05-26 MED ORDER — DIPHENHYDRAMINE HCL 12.5 MG/5ML PO ELIX: 12.5000 mg | ORAL_SOLUTION | Freq: Four times a day (QID) | ORAL | Status: DC | PRN

## 2016-05-26 MED ORDER — MORPHINE SULFATE 2 MG/ML IV SOLN
INTRAVENOUS | Status: AC
  Filled 2016-05-26: qty 30

## 2016-05-26 MED ORDER — DIPHENHYDRAMINE HCL 50 MG/ML IJ SOLN: 12.5000 mg | Freq: Four times a day (QID) | INTRAMUSCULAR | Status: DC | PRN

## 2016-05-26 MED ORDER — ONDANSETRON HCL 4 MG/2ML IJ SOLN
INTRAMUSCULAR | Status: AC
  Filled 2016-05-26: qty 2

## 2016-05-26 MED ORDER — KETOROLAC TROMETHAMINE 30 MG/ML IJ SOLN: 30.0000 mg | Freq: Four times a day (QID) | INTRAMUSCULAR | Status: DC

## 2016-05-26 MED ORDER — CEFAZOLIN SODIUM-DEXTROSE 1-4 GM/50ML-% IV SOLN
1.0000 g | INTRAVENOUS | Status: AC
  Administered 2016-05-26: 1 g via INTRAVENOUS
  Filled 2016-05-26: qty 50

## 2016-05-26 MED ORDER — OXYCODONE-ACETAMINOPHEN 5-325 MG PO TABS
1.0000 | ORAL_TABLET | ORAL | Status: DC | PRN
  Administered 2016-05-27: 2 via ORAL
  Filled 2016-05-26: qty 2

## 2016-05-26 MED ORDER — KETOROLAC TROMETHAMINE 30 MG/ML IJ SOLN: 30.0000 mg | Freq: Once | INTRAMUSCULAR | Status: DC

## 2016-05-26 MED ORDER — LACTATED RINGERS IV SOLN
INTRAVENOUS | Status: DC
  Administered 2016-05-26 (×4): via INTRAVENOUS

## 2016-05-26 MED ORDER — FENTANYL CITRATE (PF) 250 MCG/5ML IJ SOLN
INTRAMUSCULAR | Status: AC
  Filled 2016-05-26: qty 5

## 2016-05-26 MED ORDER — IBUPROFEN 600 MG PO TABS: 600.0000 mg | ORAL_TABLET | Freq: Four times a day (QID) | ORAL | Status: DC | PRN

## 2016-05-26 MED ORDER — PROPOFOL 10 MG/ML IV BOLUS
INTRAVENOUS | Status: DC | PRN
  Administered 2016-05-26: 200 mg via INTRAVENOUS

## 2016-05-26 MED ORDER — ONDANSETRON HCL 4 MG/2ML IJ SOLN
4.0000 mg | Freq: Once | INTRAMUSCULAR | Status: AC
  Administered 2016-05-26: 4 mg via INTRAVENOUS

## 2016-05-26 MED ORDER — SODIUM CHLORIDE 0.9% FLUSH: 9.0000 mL | INTRAVENOUS | Status: DC | PRN

## 2016-05-26 MED ORDER — LIDOCAINE HCL (CARDIAC) 20 MG/ML IV SOLN
INTRAVENOUS | Status: DC | PRN
  Administered 2016-05-26: 50 mg via INTRAVENOUS

## 2016-05-26 MED ORDER — KETOROLAC TROMETHAMINE 30 MG/ML IJ SOLN
30.0000 mg | Freq: Four times a day (QID) | INTRAMUSCULAR | Status: DC
  Administered 2016-05-26 – 2016-05-27 (×5): 30 mg via INTRAVENOUS
  Filled 2016-05-26 (×5): qty 1

## 2016-05-26 MED ORDER — LIDOCAINE HCL (PF) 1 % IJ SOLN
INTRAMUSCULAR | Status: AC
  Filled 2016-05-26: qty 5

## 2016-05-26 MED ORDER — MORPHINE SULFATE 2 MG/ML IV SOLN
INTRAVENOUS | Status: DC
Start: 2016-05-26 — End: 2016-05-27
  Administered 2016-05-26: 15:00:00 via INTRAVENOUS
  Administered 2016-05-27: 9 mg via INTRAVENOUS

## 2016-05-26 MED ORDER — 0.9 % SODIUM CHLORIDE (POUR BTL) OPTIME
TOPICAL | Status: DC | PRN
  Administered 2016-05-26: 1000 mL

## 2016-05-26 MED ORDER — HYDROMORPHONE HCL 1 MG/ML IJ SOLN
0.2500 mg | INTRAMUSCULAR | Status: DC | PRN
  Administered 2016-05-26 (×2): 0.5 mg via INTRAVENOUS
  Administered 2016-05-26: 1 mg via INTRAVENOUS
  Filled 2016-05-26: qty 1

## 2016-05-26 MED ORDER — ROCURONIUM BROMIDE 100 MG/10ML IV SOLN
INTRAVENOUS | Status: DC | PRN
Start: 2016-05-26 — End: 2016-05-26
  Administered 2016-05-26 (×2): 10 mg via INTRAVENOUS
  Administered 2016-05-26: 50 mg via INTRAVENOUS

## 2016-05-26 MED ORDER — PANTOPRAZOLE SODIUM 40 MG PO TBEC
40.0000 mg | DELAYED_RELEASE_TABLET | Freq: Every day | ORAL | Status: DC
  Administered 2016-05-27: 40 mg via ORAL
  Filled 2016-05-26: qty 1

## 2016-05-26 MED ORDER — HYDROMORPHONE HCL 1 MG/ML IJ SOLN
INTRAMUSCULAR | Status: AC
  Filled 2016-05-26: qty 1

## 2016-05-26 MED ORDER — GLYCOPYRROLATE 0.2 MG/ML IJ SOLN
INTRAMUSCULAR | Status: DC | PRN
  Administered 2016-05-26: 0.6 mg via INTRAVENOUS

## 2016-05-26 MED ORDER — KETOROLAC TROMETHAMINE 30 MG/ML IJ SOLN
INTRAMUSCULAR | Status: AC
  Filled 2016-05-26: qty 1

## 2016-05-26 SURGICAL SUPPLY — 58 items
APL SKNCLS STERI-STRIP NONHPOA (GAUZE/BANDAGES/DRESSINGS) ×2
BAG HAMPER (MISCELLANEOUS) ×3 IMPLANT
BENZOIN TINCTURE PRP APPL 2/3 (GAUZE/BANDAGES/DRESSINGS) ×4 IMPLANT
BLADE SURG SZ10 CARB STEEL (BLADE) ×3 IMPLANT
CLOSURE WOUND 1/2 X4 (GAUZE/BANDAGES/DRESSINGS) ×3
CLOTH BEACON ORANGE TIMEOUT ST (SAFETY) ×3 IMPLANT
COVER LIGHT HANDLE STERIS (MISCELLANEOUS) ×6 IMPLANT
DRAPE WARM FLUID 44X44 (DRAPE) ×3 IMPLANT
DRSG OPSITE POSTOP 4X10 (GAUZE/BANDAGES/DRESSINGS) ×5 IMPLANT
DRSG TEGADERM 4X4.75 (GAUZE/BANDAGES/DRESSINGS) ×2 IMPLANT
DURAPREP 26ML APPLICATOR (WOUND CARE) ×3 IMPLANT
ELECT REM PT RETURN 9FT ADLT (ELECTROSURGICAL) ×3
ELECTRODE REM PT RTRN 9FT ADLT (ELECTROSURGICAL) ×1 IMPLANT
EVACUATOR DRAINAGE 10X20 100CC (DRAIN) IMPLANT
EVACUATOR SILICONE 100CC (DRAIN)
FORMALIN 10 PREFIL 480ML (MISCELLANEOUS) ×3 IMPLANT
GLOVE BIO SURGEON STRL SZ 6.5 (GLOVE) ×2 IMPLANT
GLOVE BIO SURGEONS STRL SZ 6.5 (GLOVE) ×2
GLOVE BIOGEL PI IND STRL 6.5 (GLOVE) IMPLANT
GLOVE BIOGEL PI IND STRL 7.0 (GLOVE) ×1 IMPLANT
GLOVE BIOGEL PI IND STRL 9 (GLOVE) ×1 IMPLANT
GLOVE BIOGEL PI INDICATOR 6.5 (GLOVE) ×4
GLOVE BIOGEL PI INDICATOR 7.0 (GLOVE) ×4
GLOVE BIOGEL PI INDICATOR 9 (GLOVE) ×4
GLOVE ECLIPSE 6.5 STRL STRAW (GLOVE) ×4 IMPLANT
GLOVE ECLIPSE 9.0 STRL (GLOVE) ×5 IMPLANT
GOWN SPEC L3 XXLG W/TWL (GOWN DISPOSABLE) ×5 IMPLANT
GOWN STRL REUS W/TWL LRG LVL3 (GOWN DISPOSABLE) ×8 IMPLANT
INST SET MAJOR GENERAL (KITS) ×3 IMPLANT
KIT ROOM TURNOVER APOR (KITS) ×3 IMPLANT
MANIFOLD NEPTUNE II (INSTRUMENTS) ×3 IMPLANT
NDL HYPO 25X1 1.5 SAFETY (NEEDLE) ×1 IMPLANT
NEEDLE HYPO 25X1 1.5 SAFETY (NEEDLE) ×3 IMPLANT
NS IRRIG 1000ML POUR BTL (IV SOLUTION) ×6 IMPLANT
PACK ABDOMINAL MAJOR (CUSTOM PROCEDURE TRAY) ×3 IMPLANT
PAD ARMBOARD 7.5X6 YLW CONV (MISCELLANEOUS) ×3 IMPLANT
RETRACTOR WND ALEXIS 25 LRG (MISCELLANEOUS) IMPLANT
RTRCTR WOUND ALEXIS 25CM LRG (MISCELLANEOUS) ×3
SET BASIN LINEN APH (SET/KITS/TRAYS/PACK) ×3 IMPLANT
SPONGE LAP 18X18 X RAY DECT (DISPOSABLE) IMPLANT
STRIP CLOSURE SKIN 1/2X4 (GAUZE/BANDAGES/DRESSINGS) ×3 IMPLANT
SUT CHROMIC 0 CT 1 (SUTURE) ×28 IMPLANT
SUT CHROMIC 2 0 CT 1 (SUTURE) ×6 IMPLANT
SUT CHROMIC GUT BROWN 0 54 (SUTURE) IMPLANT
SUT CHROMIC GUT BROWN 0 54IN (SUTURE)
SUT ETHILON 3 0 FSL (SUTURE) IMPLANT
SUT PDS AB 0 CTX 60 (SUTURE) ×4 IMPLANT
SUT PDS AB CT VIOLET #0 27IN (SUTURE) IMPLANT
SUT PLAIN CT 1/2CIR 2-0 27IN (SUTURE) ×7 IMPLANT
SUT PROLENE 0 CT 1 30 (SUTURE) IMPLANT
SUT VIC AB 0 CT1 27 (SUTURE) ×3
SUT VIC AB 0 CT1 27XBRD ANTBC (SUTURE) IMPLANT
SUT VICRYL 4 0 KS 27 (SUTURE) ×2 IMPLANT
SUT VICRYL AB 2 0 TIES (SUTURE) ×1 IMPLANT
SYR CONTROL 10ML LL (SYRINGE) ×3 IMPLANT
TOWEL BLUE STERILE X RAY DET (MISCELLANEOUS) ×3 IMPLANT
TOWEL OR 17X26 4PK STRL BLUE (TOWEL DISPOSABLE) ×3 IMPLANT
TRAY FOLEY W/METER SILVER 16FR (SET/KITS/TRAYS/PACK) ×3 IMPLANT

## 2016-05-26 NOTE — Transfer of Care (Signed)
Immediate Anesthesia Transfer of Care Note  Patient: Donna Vaughan  Procedure(s) Performed: Procedure(s): ABDOMINAL SUPRACERVICAL HYSTERECTOMY (N/A)  Patient Location: PACU  Anesthesia Type:General  Level of Consciousness: awake, alert , oriented and patient cooperative  Airway & Oxygen Therapy: Patient Spontanous Breathing and Patient connected to face mask oxygen  Post-op Assessment: Report given to RN and Post -op Vital signs reviewed and stable  Post vital signs: Reviewed and stable  Last Vitals:  Vitals:   05/26/16 1110 05/26/16 1115  BP: 129/88   Pulse:    Resp: (!) 22 (!) 23  Temp:      Last Pain:  Vitals:   05/26/16 0959  TempSrc: Oral      Patients Stated Pain Goal: 4 ([id number])  Complications: No apparent anesthesia complications

## 2016-05-26 NOTE — Anesthesia Postprocedure Evaluation (Signed)
Anesthesia Post Note  Patient: Donna Vaughan  Procedure(s) Performed: Procedure(s) (LRB): ABDOMINAL SUPRACERVICAL HYSTERECTOMY (N/A)  Patient location during evaluation: PACU Anesthesia Type: General Level of consciousness: awake and alert, oriented and patient cooperative Pain management: pain level controlled Vital Signs Assessment: post-procedure vital signs reviewed and stable Respiratory status: spontaneous breathing and patient connected to nasal cannula oxygen Cardiovascular status: stable Postop Assessment: no signs of nausea or vomiting Anesthetic complications: no     Last Vitals:  Vitals:   05/26/16 1345 05/26/16 1358  BP: 132/72   Pulse: (!) 57 (!) 57  Resp: 14 13  Temp:      Last Pain:  Vitals:   05/26/16 1426  TempSrc:   PainSc: 8                  Arvel Oquinn A

## 2016-05-26 NOTE — Brief Op Note (Signed)
05/26/2016  1:47 PM  PATIENT:  Falana R Fronczak  36 y.o. female  PRE-OPERATIVE DIAGNOSIS:  Uterine Fibroid Menorrhagia s/p Endo Ablation with failed menorrhagia treatment  POST-OPERATIVE DIAGNOSIS:  Uterine Fibroids Menorrhagia status post failed endometrial ablation treatment  PROCEDURE:  Procedure(s): ABDOMINAL SUPRACERVICAL HYSTERECTOMY (N/A)  SURGEON:  Surgeon(s) and Role:    Jonnie Kind, MD - Primary  PHYSICIAN ASSISTANT:   ASSISTANTS: Corrie Dandy RNFA   ANESTHESIA:   general  EBL:  Total I/O In: 2000 [I.V.:2000] Out: 350 [Urine:250; Blood:100]  BLOOD ADMINISTERED:none  DRAINS: Urinary Catheter (Foley)   LOCAL MEDICATIONS USED:  NONE  SPECIMEN:  Source of Specimen:  Uterus  DISPOSITION OF SPECIMEN:  PATHOLOGY  COUNTS:  YES  TOURNIQUET:  * No tourniquets in log *  DICTATION: .Dragon Dictation  PLAN OF CARE: Admit to inpatient   PATIENT DISPOSITION:  PACU - hemodynamically stable.   Delay start of Pharmacological VTE agent (>24hrs) due to surgical blood loss or risk of bleeding: not applicable

## 2016-05-26 NOTE — H&P (Signed)
Progress Notes by Jonnie Kind, MD at 05/14/2016 10:30 AM   Author: Jonnie Kind, MD Author Type: Physician Filed: 05/14/2016 10:49 AM  Note Status: Signed Cosign: Cosign Not Required Encounter Date: 05/14/2016  Editor: Jonnie Kind, MD (Physician)  Expand All Collapse All   Preoperative History and Physical  Donna Vaughan is a 36 y.o. [phone] here for surgical management of uterine fibroids, symptomatic with pain, and heavy bleeding that did not improve to pt satisfaction with endometrial ablation.  No significant preoperative concerns.  Proposed surgery: abdominal supracervical hysterectomy      Past Medical History:  Diagnosis Date  . Anemia   . Anxiety   . Depression   . Fibroids   . Headache   . Hypertension   . Morbid obesity ([place])   . Pregnant   . Vitamin D insufficiency         Past Surgical History:  Procedure Laterality Date  . ADENOIDECTOMY    . CHOLECYSTECTOMY    . DILITATION & CURRETTAGE/HYSTROSCOPY WITH NOVASURE ABLATION N/A 12/31/2015   Procedure: DILATATION & CURETTAGE/HYSTEROSCOPY WITH ENDOMETRIAL  ABLATION;  Surgeon: Jonnie Kind, MD;  Location: [facility];  Service: Gynecology;  Laterality: N/A;  . FOOT SURGERY Left    removal of foreign body  . LAPAROSCOPIC BILATERAL SALPINGECTOMY Bilateral 12/31/2015   Procedure: LAPAROSCOPIC BILATERAL SALPINGECTOMY;  Surgeon: Jonnie Kind, MD;  Location: [facility];  Service: Gynecology;  Laterality: Bilateral;  . TONSILLECTOMY                     OB History  Gravida Para Term Preterm AB Living  5 4 3 1 1 3   SAB TAB Ectopic Multiple Live Births  1     0 3    # Outcome Date GA Lbr Len/2nd Weight Sex Delivery Anes PTL Lv  5 Preterm 10/30/15 [redacted]w[redacted]d / 00:02 9.5 oz (0.269 kg) M Vag-Spont EPI  FD  4 Term 12/04/13 [redacted]w[redacted]d  6 lb 11 oz (3.033 kg) M Vag-Spont EPI N LIV  3 SAB 01/2013     SAB   FD  2 Term 11/09/07 [redacted]w[redacted]d  6 lb 12 oz (3.062 kg) M Vag-Spont EPI N LIV  1 Term 02/10/99  [redacted]w[redacted]d  8 lb 3 oz (3.714 kg) M Vag-Spont  N LIV    Patient denies any other pertinent gynecologic issues.         Current Outpatient Prescriptions on File Prior to Visit  Medication Sig Dispense Refill  . acetaminophen (TYLENOL) 500 MG tablet Take 500 mg by mouth daily as needed for moderate pain or headache.    . diphenhydrAMINE (BENADRYL) 25 MG tablet Take 25 mg by mouth daily as needed for allergies.    . fluticasone (FLONASE) 50 MCG/ACT nasal spray Place 1 spray into both nostrils daily as needed for allergies or rhinitis.    . ramipril (ALTACE) 5 MG capsule Take 1 capsule (5 mg total) by mouth daily. (Patient taking differently: Take 5 mg by mouth at bedtime. ) 30 capsule 5  . triamterene-hydrochlorothiazide (MAXZIDE) 75-50 MG tablet Take 1 tablet by mouth daily. (Patient taking differently: Take 1 tablet by mouth at bedtime. ) 30 tablet 6  . Aspirin-Salicylamide-Caffeine (BC HEADACHE PO) Take 1 packet by mouth daily as needed (headaches).     No current facility-administered medications on file prior to visit.    No Known Allergies  Social History:   reports that she has never smoked. She has never used smokeless  tobacco. She reports that she does not drink alcohol or use drugs.        Family History  Problem Relation Age of Onset  . Hypertension Brother   . Asthma Son   . Diabetes Maternal Uncle   . Stroke Maternal Grandfather   . Hypertension Paternal Grandmother   . Heart disease Paternal Grandmother     CHF  . COPD Son     bronchitis    Review of Systems: Noncontributory  PHYSICAL EXAM: Blood pressure 130/82, pulse 93, height 5\' 5"  (1.651 m), weight 273 lb (123.8 kg). General appearance - alert, well appearing, and in no distress PERRL EOMI Alert, oriented,  Chest - clear to auscultation, no wheezes, rales or rhonchi, symmetric air entry Heart - normal rate and regular rhythm  Abdomen - soft, nontender, nondistended, no masses or  organomegaly Obese, no masses or hernias. Well healed laparoscopy scars s/p salpingectomy for sterilization                      Pelvic - examination EFG normal             Vag: normal good support                     Pap normal by primary care w.in a year Extremities - peripheral pulses normal, no pedal edema, no clubbing or cyanosis  Labs: CBC    Component Value Date/Time   WBC 9.4 05/21/2016 1532   RBC 3.88 05/21/2016 1532   HGB 10.9 (L) 05/21/2016 1532   HGB 11.2 (L) 12/04/2013 0022   HCT 33.1 (L) 05/21/2016 1532   HCT 34.7 08/28/2015 1550   PLT 337 05/21/2016 1532   PLT 295 08/28/2015 1550   MCV 85.3 05/21/2016 1532   MCV 83 08/28/2015 1550   MCV 89 12/04/2013 0022   MCH 28.1 05/21/2016 1532   MCHC 32.9 05/21/2016 1532   RDW 13.5 05/21/2016 1532   RDW 17.9 (H) 08/28/2015 1550   RDW 14.5 12/04/2013 0022   LYMPHSABS 2.1 12/04/2013 0022   MONOABS 0.6 12/04/2013 0022   EOSABS 0.1 12/04/2013 0022   BASOSABS 0.0 12/04/2013 0022   BMET    Component Value Date/Time   NA 139 05/21/2016 1532   NA 137 08/28/2015 1550   K 4.0 05/21/2016 1532   CL 109 05/21/2016 1532   CO2 24 05/21/2016 1532   GLUCOSE 89 05/21/2016 1532   BUN 13 05/21/2016 1532   BUN 6 08/28/2015 1550   CREATININE 0.86 05/21/2016 1532   CALCIUM 8.9 05/21/2016 1532   GFRNONAA >60 05/21/2016 1532   GFRAA >60 05/21/2016 1532     Imaging Studies: ImagingResults  No results found.    Assessment:      Patient Active Problem List   Diagnosis Date Noted  . Fibroid uterus 08/04/2014    Priority: Low  . S/P endometrial ablation 04/15/2016  . Encounter for sterilization 12/31/2015  . Menorrhagia 12/31/2015  . Prior pregnancy with fetal demise 11/12/2015  . OBESITY 05/30/2007  . Chronic hypertension 05/30/2007    Plan: Patient will undergo surgical management with abdominal supracervical hysterectomy. Probable date 15 May. Pt has had bilateral salpingectomy in past. Ovarian preservation  planned, pro's and con's of cervical preservation discussed. Patient in informed that continued monitoring of the cervix with regular pap smears will be advised. Risks benefits, alternatives discussed, pt questions answered to pt satisfaction.Jonnie Kind, MD  05/14/2016 10:41 AM

## 2016-05-26 NOTE — Anesthesia Preprocedure Evaluation (Signed)
Anesthesia Evaluation  Patient identified by MRN, date of birth, ID band Patient awake    Reviewed: Allergy & Precautions, NPO status , Patient's Chart, lab work & pertinent test results  Airway Mallampati: II  TM Distance: >3 FB Neck ROM: Full    Dental  (+) Teeth Intact   Pulmonary neg pulmonary ROS,    breath sounds clear to auscultation       Cardiovascular hypertension, Pt. on medications  Rhythm:Regular Rate:Normal     Neuro/Psych  Headaches, PSYCHIATRIC DISORDERS Anxiety Depression    GI/Hepatic negative GI ROS,   Endo/Other  Morbid obesity  Renal/GU      Musculoskeletal   Abdominal   Peds  Hematology  (+) anemia ,   Anesthesia Other Findings   Reproductive/Obstetrics                             Anesthesia Physical Anesthesia Plan  ASA: II  Anesthesia Plan: General   Post-op Pain Management:    Induction: Intravenous  Airway Management Planned: Oral ETT  Additional Equipment:   Intra-op Plan:   Post-operative Plan: Extubation in OR  Informed Consent: I have reviewed the patients History and Physical, chart, labs and discussed the procedure including the risks, benefits and alternatives for the proposed anesthesia with the patient or authorized representative who has indicated his/her understanding and acceptance.     Plan Discussed with:   Anesthesia Plan Comments:         Anesthesia Quick Evaluation

## 2016-05-26 NOTE — Anesthesia Procedure Notes (Signed)
Procedure Name: Intubation Date/Time: 05/26/2016 11:29 AM Performed by: Raenette Rover Pre-anesthesia Checklist: Patient identified, Emergency Drugs available, Suction available and Patient being monitored Patient Re-evaluated:Patient Re-evaluated prior to inductionOxygen Delivery Method: Circle system utilized Preoxygenation: Pre-oxygenation with 100% oxygen Intubation Type: IV induction Ventilation: Mask ventilation without difficulty Laryngoscope Size: Mac and 3 Grade View: Grade I Tube type: Oral Tube size: 7.0 mm Number of attempts: 1 Airway Equipment and Method: Stylet Placement Confirmation: ETT inserted through vocal cords under direct vision,  positive ETCO2,  CO2 detector and breath sounds checked- equal and bilateral Secured at: 20 cm Tube secured with: Tape Dental Injury: Teeth and Oropharynx as per pre-operative assessment

## 2016-05-26 NOTE — Op Note (Signed)
05/26/2016  1:47 PM  PATIENT:  Donna Vaughan  36 y.o. female  PRE-OPERATIVE DIAGNOSIS:  Uterine Fibroid Menorrhagia s/p Endo Ablation with failed menorrhagia treatment  POST-OPERATIVE DIAGNOSIS:  Uterine Fibroids Menorrhagia status post failed endometrial ablation treatment  PROCEDURE:  Procedure(s): ABDOMINAL SUPRACERVICAL HYSTERECTOMY (N/A)  SURGEON:  Surgeon(s) and Role:    Jonnie Kind, MD - Primary  PHYSICIAN ASSISTANT:   ASSISTANTS: Corrie Dandy RNFA   ANESTHESIA:   general  EBL:  Total I/O In: 2000 [I.V.:2000] Out: 350 [Urine:250; Blood:100]  BLOOD ADMINISTERED:none  DRAINS: Urinary Catheter (Foley)   LOCAL MEDICATIONS USED:  NONE  SPECIMEN:  Source of Specimen:  Uterus  DISPOSITION OF SPECIMEN:  PATHOLOGY  COUNTS:  YES  TOURNIQUET:  * No tourniquets in log *  DICTATION: .Dragon Dictation  PLAN OF CARE: Admit to inpatient   PATIENT DISPOSITION:  PACU - hemodynamically stable.   Delay start of Pharmacological VTE agent (>24hrs) due to surgical blood loss or risk of bleeding: not applicable  Details of procedure: Patient was taken to the operating room prepped and draped for lower abdominal surgery. Timeout was conducted and Ancef administered 3 g. A transverse lower abdominal incision was made in the method of Pelosi , 40 cm wide time 10 cm maximal width ellipse of skin and underlying fatty tissue was excised to improved pelvic access and position the incision above the pannus crease for improved healing. 2 cm of subcutaneous fat was left overlying the fascia. The fascia was entered in the midline making the incision and this technique of Pelosi completed. Alexis retractor was positioned and the uterus elevated sufficiently that the pelvis could be inspected. The uterus was adequately accessed with bowel packed away using 3 laparotomy tapes and a rolled radio graphic visible towel. Uterus was then taken down beginning on the left side taking down  the round ligament, then isolating the left utero-ovarian ligament double clamping and cutting it and ligating it. Chromic was used for suture. The uterine vessels doubly clamped with curved Heaney clamps as well as a Georgina Peer, doubly ligated after transection. Upper cardinal ligament support was clamped cut and suture ligated using straight Heaney clamps knife dissection and 0 chromic suture as it was used throughout the case. The right side was treated similarly. Bladder flap had been developed anteriorly at the commencement of addressing the uterine vessels. This time the uterus was amputated off of the cervix and a conical fashion, and then the cervical. Oversewn anterior to posterior with 4 sutures of 0 chromic placed in a figure-of-eight. Hemostasis was good. Pelvis was irrigated. Bladder flap edge was loosely attached to the backside of the cervical cuff in the midline with 2-0 chromic.. Laparotomy equipment was removed. The peritoneum closed in the midline with continuous running 2-0 chromic. Fascia was then closed using 0 PDS doubly looped and closed beginning at each end and sewing to toward the midportion of the incision. Subcutaneous tissues were then addressed. Copious irrigation was again performed, and then the subcutaneous fat reapproximated with a series of horizontal mattress sutures of 20 plain. Sub cuticular 4-0 Vicryl was then used to close the skin with benzoin and Steri-Strips and honeycomb dressing placed. Sponge and needle counts were correct throughout patient tolerated procedure well with 100 cc blood loss

## 2016-05-27 ENCOUNTER — Encounter (HOSPITAL_COMMUNITY): Payer: Self-pay | Admitting: Obstetrics and Gynecology

## 2016-05-27 LAB — CBC
HEMATOCRIT: 30.9 % — AB (ref 36.0–46.0)
HEMOGLOBIN: 10.1 g/dL — AB (ref 12.0–15.0)
MCH: 28.2 pg (ref 26.0–34.0)
MCHC: 32.7 g/dL (ref 30.0–36.0)
MCV: 86.3 fL (ref 78.0–100.0)
Platelets: 318 10*3/uL (ref 150–400)
RBC: 3.58 MIL/uL — AB (ref 3.87–5.11)
RDW: 13.6 % (ref 11.5–15.5)
WBC: 11.4 10*3/uL — ABNORMAL HIGH (ref 4.0–10.5)

## 2016-05-27 LAB — BASIC METABOLIC PANEL
Anion gap: 7 (ref 5–15)
BUN: 11 mg/dL (ref 6–20)
CHLORIDE: 105 mmol/L (ref 101–111)
CO2: 24 mmol/L (ref 22–32)
CREATININE: 0.83 mg/dL (ref 0.44–1.00)
Calcium: 8 mg/dL — ABNORMAL LOW (ref 8.9–10.3)
GFR calc Af Amer: >60 mL/min (ref >60)
GFR calc non Af Amer: >60 mL/min (ref >60)
Glucose, Bld: 107 mg/dL — ABNORMAL HIGH (ref 65–99)
POTASSIUM: 3.8 mmol/L (ref 3.5–5.1)
SODIUM: 136 mmol/L (ref 135–145)

## 2016-05-27 MED ORDER — MORPHINE SULFATE (PF) 2 MG/ML IV SOLN: 1.0000 mg | INTRAVENOUS | Status: DC | PRN

## 2016-05-27 MED ORDER — ORAL CARE MOUTH RINSE: 15.0000 mL | Freq: Two times a day (BID) | OROMUCOSAL | Status: DC

## 2016-05-27 MED ORDER — IBUPROFEN 600 MG PO TABS: 600.0000 mg | ORAL_TABLET | Freq: Four times a day (QID) | ORAL | 0 refills | Status: DC | PRN

## 2016-05-27 MED ORDER — OXYCODONE-ACETAMINOPHEN 5-325 MG PO TABS: 1.0000 | ORAL_TABLET | ORAL | 0 refills | Status: DC | PRN

## 2016-05-27 NOTE — Anesthesia Postprocedure Evaluation (Signed)
Anesthesia Post Note  Patient: Donna Vaughan  Procedure(s) Performed: Procedure(s) (LRB): ABDOMINAL SUPRACERVICAL HYSTERECTOMY (N/A)  Patient location during evaluation: PACU Anesthesia Type: General Level of consciousness: awake and alert, oriented and patient cooperative Pain management: pain level controlled (Pain relief with Percocet) Vital Signs Assessment: post-procedure vital signs reviewed and stable Respiratory status: spontaneous breathing and respiratory function stable Cardiovascular status: stable Postop Assessment: no signs of nausea or vomiting Anesthetic complications: no     Last Vitals:  Vitals:   05/27/16 0736 05/27/16 1011  BP:  125/64  Pulse:  100  Resp: 16 17  Temp:  36.9 C    Last Pain:  Vitals:   05/27/16 1140  TempSrc:   PainSc: 3                  Alyviah Crandle A

## 2016-05-27 NOTE — Discharge Instructions (Signed)
Abdominal Hysterectomy °Abdominal hysterectomy is a surgical procedure to remove the womb (uterus). The uterus is the muscular organ that houses a developing baby. This surgery may be done if: °· You have cancer. °· You have growths (tumors or fibroids) in the uterus. °· You have long-term (chronic) pain. °· You are bleeding. °· Your uterus has slipped down into your vagina (uterine prolapse). °· You have a condition in which the tissue that lines the uterus grows outside of its normal location (endometriosis). °· You have an infection in your uterus. °· You are having problems with your menstrual cycle. ° °Depending on why you are having this procedure, you may also have other reproductive organs removed. These could include: °· The part of your vagina that connects with your uterus (cervix). °· The organs that make eggs (ovaries). °· The tubes that connect the ovaries to the uterus (fallopian tubes). ° °Tell a health care provider about: °· Any allergies you have. °· All medicines you are taking, including vitamins, herbs, eye drops, creams, and over-the-counter medicines. °· Any problems you or family members have had with anesthetic medicines. °· Any blood disorders you have. °· Any surgeries you have had. °· Any medical conditions you have. °· Whether you are pregnant or may be pregnant. °What are the risks? °Generally, this is a safe procedure. However, problems may occur, including: °· Bleeding. °· Infection. °· Allergic reactions to medicines or dyes. °· Damage to other structures or organs. °· Nerve injury. °· Decreased interest in sex or pain during sex. °· Blood clots that can break free and travel to your lungs. ° °What happens before the procedure? °Staying hydrated °Follow instructions from your health care provider about hydration, which may include: °· Up to 2 hours before the procedure - you may continue to drink clear liquids, such as water, clear fruit juice, black coffee, and plain tea ° °Eating  and drinking restrictions °Follow instructions from your health care provider about eating and drinking, which may include: °· 8 hours before the procedure - stop eating heavy meals or foods such as meat, fried foods, or fatty foods. °· 6 hours before the procedure - stop eating light meals or foods, such as toast or cereal. °· 6 hours before the procedure - stop drinking milk or drinks that contain milk. °· 2 hours before the procedure - stop drinking clear liquids. ° °Medicines °· Ask your health care provider about: °? Changing or stopping your regular medicines. This is especially important if you are taking diabetes medicines or blood thinners. °? Taking medicines such as aspirin and ibuprofen. These medicines can thin your blood. Do not take these medicines before your procedure if your health care provider instructs you not to. °· You may be given antibiotic medicine to help prevent infection. Take it as told by your health care provider. °· You may be asked to take laxatives to prevent constipation. °General instructions °· Ask your health care provider how your surgical site will be marked or identified. °· You may be asked to shower with a germ-killing soap. °· Plan to have someone take you home from the hospital. °· Do not use any products that contain nicotine or tobacco, such as cigarettes and e-cigarettes. If you need help quitting, ask your health care provider. °· You may have an exam or testing. °· You may have a blood or urine sample taken. °· You may need to have an enema to clean out your rectum and lower colon. °· This   procedure can affect the way you feel about yourself. Talk to your health care provider about the physical and emotional changes this procedure may cause. °What happens during the procedure? °· To lower your risk of infection: °? Your health care team will wash or sanitize their hands. °? Your skin will be washed with soap. °? Hair may be removed from the surgical area. °· An IV  tube will be inserted into one of your veins. °· You will be given one or more of the following: °? A medicine to help you relax (sedative). °? A medicine to make you fall asleep (general anesthetic). °· Tight-fitting (compression) stockings will be placed on your legs to promote circulation. °· A thin, flexible tube (catheter) will be inserted to help drain your urine. °· The surgeon will make a cut (incision) through the skin in your lower belly. The incision may go side-to-side or up-and-down. °· The surgeon will move aside the body tissue that covers your uterus. The surgeon will then carefully take out your uterus along with any of the other organs that need to be removed. °· Bleeding will be controlled with clamps or sutures. °· The surgeon will close your incision with stitches (sutures), skin glue, or adhesive strips. °· A bandage (dressing) will be placed over the incision. °The procedure may vary among health care providers and hospitals. °What happens after the procedure? °· You will be given pain medicine as needed. °· Your blood pressure, heart rate, breathing rate, and blood oxygen level will be monitored until the medicines you were given have worn off. °· You will need to stay in the hospital to recover for one to two days. Ask your health care provider how long you will need to stay in the hospital after your procedure. °· You may have a liquid diet at first. You will most likely return to your usual diet the day after surgery. °· You will still have the urinary catheter in place. It will likely be removed the day after surgery. °· You may have to wear compression stockings. These stockings help to prevent blood clots and reduce swelling in your legs. °· You will be encouraged to walk as soon as possible. You will also use a device or do breathing exercises to keep your lungs clear. °· You may need to use a sanitary napkin for vaginal discharge. °Summary °· Abdominal hysterectomy is a surgical  procedure to remove the womb (uterus). The uterus is the muscular organ that houses a developing baby. °· This procedure can affect the way you feel about yourself. Talk to your health care provider about the physical and emotional changes this procedure may cause. °· You will be given medicines for pain after the procedure. °· You will need to stay in the hospital to recover. Ask your health care provider how long you will need to stay in the hospital after your procedure. °This information is not intended to replace advice given to you by your health care provider. Make sure you discuss any questions you have with your health care provider. °Document Released: 01/03/2013 Document Revised: 12/18/2015 Document Reviewed: 12/18/2015 °Elsevier Interactive Patient Education © 2017 [employer] ° °

## 2016-05-27 NOTE — Progress Notes (Signed)
1 Day Post-Op Procedure(s) (LRB): ABDOMINAL SUPRACERVICAL HYSTERECTOMY (N/A)  Subjective: Patient reports incisional pain.  That is mild, and she is willing to go to by mouth pain medicines.  Objective: I have reviewed patient's vital signs, intake and output and labs.  General: alert, cooperative and no distress Resp: clear to auscultation bilaterally GI: soft, non-tender; bowel sounds normal; no masses,  no organomegaly and incision: clean, dry and Minimal redness at the ends of the incision there is no swelling or suspicion of incisional bleeding Negative Homans CBC Latest Ref Rng & Units 05/27/2016 05/21/2016 04/16/2016  WBC 4.0 - 10.5 K/uL 11.4(H) 9.4 10.4  Hemoglobin 12.0 - 15.0 g/dL 10.1(L) 10.9(L) 11.2(L)  Hematocrit 36.0 - 46.0 % 30.9(L) 33.1(L) 34.1(L)  Platelets 150 - 400 K/uL 318 337 339   BMP Latest Ref Rng & Units 05/27/2016 05/21/2016 04/16/2016  Glucose 65 - 99 mg/dL 107(H) 89 82  BUN 6 - 20 mg/dL 11 13 16   Creatinine 0.44 - 1.00 mg/dL 0.83 0.86 1.01(H)  BUN/Creat Ratio 9 - 23 - - -  Sodium 135 - 145 mmol/L 136 139 138  Potassium 3.5 - 5.1 mmol/L 3.8 4.0 4.6  Chloride 101 - 111 mmol/L 105 109 104  CO2 22 - 32 mmol/L 24 24 26   Calcium 8.9 - 10.3 mg/dL 8.0(L) 8.9 8.9    Intake/Output Summary (Last 24 hours) at 05/27/16 0718 Last data filed at 05/27/16 5670  Gross per 24 hour  Intake          3622.92 ml  Output             2250 ml  Net          1372.92 ml      Assessment: s/p Procedure(s): ABDOMINAL SUPRACERVICAL HYSTERECTOMY (N/A): stable, progressing well and tolerating diet  Plan: Advance diet Encourage ambulation Advance to PO medication Discontinue IV fluids  LOS: 1 day  Will reevaluate patient at the end of the day, because she may want to go home  [place] V 05/27/2016, 7:17 AM

## 2016-05-27 NOTE — Discharge Summary (Signed)
Physician Discharge Summary  Patient ID: Donna Vaughan MRN: [id number] DOB/AGE: Dec 16, 1980 36 y.o.  Admit date: 05/26/2016 Discharge date: 05/27/2016  Admission Diagnoses:uterine fibroids, menorrhagia  Discharge Diagnoses: same ,also Active Problems:   Status post abdominal supracervical subtotal hysterectomy   Discharged Condition: good  Hospital Course: patient was admitted as outpatient for abdominal supracervical hysterectomy, wide Pelosi type incision with excision of portion of the pannus, did well and went home on POD 1.  Consults: None  Significant Diagnostic Studies: labs:  CBC Latest Ref Rng & Units 05/27/2016 05/21/2016 04/16/2016  WBC 4.0 - 10.5 K/uL 11.4(H) 9.4 10.4  Hemoglobin 12.0 - 15.0 g/dL 10.1(L) 10.9(L) 11.2(L)  Hematocrit 36.0 - 46.0 % 30.9(L) 33.1(L) 34.1(L)  Platelets 150 - 400 K/uL 318 337 339   BMET    Component Value Date/Time   NA 136 05/27/2016 0424   NA 137 08/28/2015 1550   K 3.8 05/27/2016 0424   CL 105 05/27/2016 0424   CO2 24 05/27/2016 0424   GLUCOSE 107 (H) 05/27/2016 0424   BUN 11 05/27/2016 0424   BUN 6 08/28/2015 1550   CREATININE 0.83 05/27/2016 0424   CALCIUM 8.0 (L) 05/27/2016 0424   GFRNONAA >60 05/27/2016 0424   GFRAA >60 05/27/2016 0424     Treatments: surgery: abdominal supracervical hysterectomy  Discharge Exam: Blood pressure 134/71, pulse (!) 106, temperature 99.1 F (37.3 C), temperature source Oral, resp. rate 18, height 5\' 5"  (1.651 m), weight 272 lb 4.3 oz (123.5 kg), SpO2 100 %. General appearance: alert, cooperative, appears stated age and morbidly obese Head: Normocephalic, without obvious abnormality, atraumatic GI: soft, non-tender; bowel sounds normal; no masses,  no organomegaly Extremities: extremities normal, atraumatic, no cyanosis or edema + flatus. Disposition: 01-Home or Self Care  Discharge Instructions    Call MD for:  persistant nausea and vomiting    Complete by:  As directed    Call MD for:   redness, tenderness, or signs of infection (pain, swelling, redness, odor or green/yellow discharge around incision site)    Complete by:  As directed    Call MD for:  severe uncontrolled pain    Complete by:  As directed    Call MD for:  temperature >100.4    Complete by:  As directed    Diet - low sodium heart healthy    Complete by:  As directed    Increase activity slowly    Complete by:  As directed      Allergies as of 05/27/2016   No Known Allergies     Medication List    TAKE these medications   acetaminophen 500 MG tablet Commonly known as:  TYLENOL Take 500 mg by mouth daily as needed for moderate pain or headache.   BC HEADACHE PO Take 1 packet by mouth daily as needed (headaches).   diphenhydrAMINE 25 MG tablet Commonly known as:  BENADRYL Take 25 mg by mouth daily as needed for allergies.   fluticasone 50 MCG/ACT nasal spray Commonly known as:  FLONASE Place 1 spray into both nostrils daily as needed for allergies or rhinitis.   ibuprofen 600 MG tablet Commonly known as:  ADVIL,MOTRIN Take 1 tablet (600 mg total) by mouth every 6 (six) hours as needed (mild pain).   oxyCODONE-acetaminophen 5-325 MG tablet Commonly known as:  PERCOCET/ROXICET Take 1-2 tablets by mouth every 4 (four) hours as needed for moderate pain or severe pain.   ramipril 5 MG capsule Commonly known as:  ALTACE Take 1 capsule (  5 mg total) by mouth daily. What changed:  when to take this   triamterene-hydrochlorothiazide 75-50 MG tablet Commonly known as:  MAXZIDE Take 1 tablet by mouth daily. What changed:  when to take this      Follow-up Information    Jonnie Kind, MD Follow up in 2 week(s).   Specialties:  Obstetrics and Gynecology, Radiology Why:  call for an appointment to check incision. may remove dressing in a week. leave steristrips in place Contact information: [place] [address] [phone]           Signed: Jonnie Kind 05/27/2016, 5:51 PM

## 2016-05-27 NOTE — Progress Notes (Signed)
Foley cath removed per order postop day one.  Patient tolerated well.

## 2016-05-27 NOTE — Addendum Note (Signed)
Addendum  created 05/27/16 1551 by Mickel Baas, CRNA   Sign clinical note

## 2016-05-27 NOTE — Progress Notes (Signed)
Patient ambulating well around room with no assist.  Will pass to day nurse to ambulate in halls if patient is up to it.

## 2016-06-03 ENCOUNTER — Encounter: Payer: Self-pay | Admitting: Obstetrics and Gynecology

## 2016-06-03 ENCOUNTER — Ambulatory Visit (INDEPENDENT_AMBULATORY_CARE_PROVIDER_SITE_OTHER): Payer: Medicaid Other | Admitting: Obstetrics and Gynecology

## 2016-06-03 VITALS — BP 124/86 | HR 114 | Wt 270.0 lb

## 2016-06-03 DIAGNOSIS — D251 Intramural leiomyoma of uterus: Secondary | ICD-10-CM

## 2016-06-03 DIAGNOSIS — D250 Submucous leiomyoma of uterus: Secondary | ICD-10-CM

## 2016-06-03 DIAGNOSIS — D252 Subserosal leiomyoma of uterus: Secondary | ICD-10-CM

## 2016-06-03 DIAGNOSIS — Z9889 Other specified postprocedural states: Secondary | ICD-10-CM

## 2016-06-03 DIAGNOSIS — Z09 Encounter for follow-up examination after completed treatment for conditions other than malignant neoplasm: Secondary | ICD-10-CM

## 2016-06-03 MED ORDER — TRAMADOL HCL 50 MG PO TABS: 50.0000 mg | ORAL_TABLET | Freq: Four times a day (QID) | ORAL | 0 refills | Status: DC | PRN

## 2016-06-03 MED ORDER — CYCLOBENZAPRINE HCL 10 MG PO TABS: 10.0000 mg | ORAL_TABLET | Freq: Three times a day (TID) | ORAL | 1 refills | Status: DC | PRN

## 2016-06-03 NOTE — Progress Notes (Signed)
Spinal and epidural vascular is present when she walks Down's (as were just with Steri-Strips measuring at the Patient ID: Donna Vaughan, female   DOB: 1980/03/17, 36 y.o.   MRN: [id number]   Subjective:  Donna Vaughan is a 36 y.o. female now 1w s/p abdominal supracervical hysterectomy   Chief Complaint  Patient presents with  . Routine Post Op    She states she is experiencing lower abdominal throbbing since the procedure, which is unrelieved with ibuprofen. Pt states Percocet is causing nausea.   Review of Systems Negative except abdominal pain    Diet:   normal   Bowel movements : normal.  Pain is not well controlled.  Medications being used: ibuprofen (OTC).  Objective:  There were no vitals taken for this visit. General:Well developed, well nourished.  No acute distress. Abdomen: Bowel sounds normal, soft, non-tender.  Incision(s):   Healing well, no drainage, no erythema, no hernia, no swelling, no dehiscence   Assessment:  Post-Op 1w s/p abdominal supracervical hysterectomy   Doing well postoperatively.   Plan:  1.Wound care discussed   2. Current medications: will rx Flexeril and Tramadol  3. Activity restrictions: No heavy lifting/straining  4. return to work: not applicable. 5. Follow up in 4 weeks    By signing my name below, I, Hansel Feinstein, attest that this documentation has been prepared under the direction and in the presence of Jonnie Kind, MD. Electronically Signed: Hansel Feinstein, ED Scribe. 06/03/16. 1:58 PM.  I personally performed the services described in this documentation, which was SCRIBED in my presence. The recorded information has been reviewed and considered accurate. It has been edited as necessary during review. Jonnie Kind, MD

## 2016-06-09 ENCOUNTER — Telehealth: Payer: Self-pay | Admitting: *Deleted

## 2016-06-09 NOTE — Telephone Encounter (Signed)
Patient called stating she noticed part of her incision site is "open slightly", no bleeding, redness. She states it was like that when Dr Glo Herring removed her steri strips but it has not gotten larger. Informed patient to keep the area clean, dry and to monitor for fever, odor, redness. Verbalized understanding.

## 2016-06-11 ENCOUNTER — Telehealth: Payer: Self-pay | Admitting: *Deleted

## 2016-06-11 NOTE — Telephone Encounter (Signed)
Just keep doing what she is doing, it will close up on its own

## 2016-06-11 NOTE — Telephone Encounter (Signed)
Patient called stating she has a small sized "hole" in the middle of her incision, comparable to the size of pinkie nail bed . No bleeding or odor but yellow pus drainage. She has been keeping it clean and dry but is concerned. She has post op check with Dr Glo Herring on 5/23.Please advise.

## 2016-06-12 ENCOUNTER — Telehealth: Payer: Self-pay | Admitting: *Deleted

## 2016-06-12 NOTE — Telephone Encounter (Signed)
Informed patient per Dr Elonda Husky just keep doing what she is doing, it will close up on its own. Advised to let us know if it opens up more or stops bleeding.

## 2016-06-15 ENCOUNTER — Encounter: Payer: Self-pay | Admitting: Obstetrics and Gynecology

## 2016-06-15 ENCOUNTER — Ambulatory Visit (INDEPENDENT_AMBULATORY_CARE_PROVIDER_SITE_OTHER): Payer: Medicaid Other | Admitting: Obstetrics and Gynecology

## 2016-06-15 VITALS — BP 144/70 | HR 88 | Wt 265.2 lb

## 2016-06-15 DIAGNOSIS — O900 Disruption of cesarean delivery wound: Secondary | ICD-10-CM

## 2016-06-15 DIAGNOSIS — Z9889 Other specified postprocedural states: Secondary | ICD-10-CM

## 2016-06-15 MED ORDER — OXYCODONE-ACETAMINOPHEN 5-325 MG PO TABS: 1.0000 | ORAL_TABLET | ORAL | 0 refills | Status: DC | PRN

## 2016-06-15 NOTE — Progress Notes (Signed)
   Subjective:  Donna Vaughan is a 36 y.o. female now 3 weeks status post supracervical hysterectomy. She states the incision site has opened up; notes mild weeping and moderate pain from the site. At this time she has no other acute complaints or symptoms.   Review of Systems Negative except for the above   Diet:   normal  Objective:  BP (!) 144/70 (BP Location: Left Arm, Patient Position: Sitting, Cuff Size: Large)   Pulse 88   Wt 265 lb 3.2 oz (120.3 kg)   BMI 44.13 kg/m  General:Well developed, well nourished.  No acute distress. Abdomen: Bowel sounds normal, soft, non-tender. Pelvic Exam: not indicated  Incision(s):  6 cm area of separation to the mid portion of incision with clear serous drainage consistent with granulation tissue or separation due to activity. DEPTH IS SUPERIFICIAL 3-5 MM    Assessment:  Post-Op 3 weeks s/p supracervical hysterectomy    Overall doing well postoperatively.  Plan:  1.Wound care discussed: change dressing once a day 2. Current medications: Percocet 5mg  x 30.  3. Decrease activity 4. Return to work: 4 weeks. 5. Follow up in 4 days. Consider silver nitrate treatment on 06/19/2016   By signing my name below, I, Evelene Croon, attest that this documentation has been prepared under the direction and in the presence of Jonnie Kind, MD . Electronically Signed: Evelene Croon, Scribe. 06/15/2016. 3:10 PM.

## 2016-06-19 ENCOUNTER — Encounter: Payer: Self-pay | Admitting: Obstetrics and Gynecology

## 2016-06-19 ENCOUNTER — Ambulatory Visit (INDEPENDENT_AMBULATORY_CARE_PROVIDER_SITE_OTHER): Payer: Medicaid Other | Admitting: Obstetrics and Gynecology

## 2016-06-19 VITALS — BP 138/106 | HR 80 | Ht 65.0 in | Wt 268.8 lb

## 2016-06-19 DIAGNOSIS — T814XXS Infection following a procedure, sequela: Principal | ICD-10-CM

## 2016-06-19 DIAGNOSIS — Z9889 Other specified postprocedural states: Secondary | ICD-10-CM

## 2016-06-19 DIAGNOSIS — Z09 Encounter for follow-up examination after completed treatment for conditions other than malignant neoplasm: Secondary | ICD-10-CM

## 2016-06-19 DIAGNOSIS — IMO0001 Reserved for inherently not codable concepts without codable children: Secondary | ICD-10-CM

## 2016-06-19 MED ORDER — CIPROFLOXACIN HCL 500 MG PO TABS: 500.0000 mg | ORAL_TABLET | Freq: Two times a day (BID) | ORAL | 0 refills | Status: DC

## 2016-06-19 MED ORDER — METRONIDAZOLE 500 MG PO TABS: 500.0000 mg | ORAL_TABLET | Freq: Two times a day (BID) | ORAL | 0 refills | Status: DC

## 2016-06-19 NOTE — Progress Notes (Addendum)
   Subjective:  Donna Vaughan is a 36 y.o. female now 4 weeks status post supracervical hysterectomy. She presents today to follow up concerning wound separation. Pt has no other acute complaints or associated symptoms at this time.    Review of Systems Negative except as noted in the HPI  Objective:  BP (!) 138/106 (BP Location: Left Arm, Patient Position: Sitting, Cuff Size: Large)   Pulse 80   Ht 5\' 5"  (1.651 m)   Wt 268 lb 12.8 oz (121.9 kg)   BMI 44.73 kg/m  General:Well developed, well nourished.  No acute distress. Abdomen: Bowel sounds normal, soft, non-tender. Pelvic Exam:    External Genitalia:  Normal.   Incision(s):  Superficial wound seperation less than 1 cm deep in efforts to probe. SLIGHT MALODOR. SOME FATTY NECROSIS WITH LIQUEFIED FAT EXPRESSED, AND CULTURE TAKEN           Assessment:  Post-Op 4 weeks s/p supracervical hysterectomy. Superficial wound separation, now with possible super imposed infection Pt is otherwise doing well postoperatively.   Plan:  1.Wound care discussed: Culture taken; dressing change BID 2. . current medications.Started on cipro and flagyl 3. Activity restrictions:  May shower; check temp daily 4. Return to work: 2-3 weeks. 5. Follow up in 1 week.   By signing my name below, I, Evelene Croon, attest that this documentation has been prepared under the direction and in the presence of Jonnie Kind, MD . Electronically Signed: Evelene Croon, Scribe. 06/19/2016. 9:13 AM. I personally performed the services described in this documentation, which was SCRIBED in my presence. The recorded information has been reviewed and considered accurate. It has been edited as necessary during review. Jonnie Kind, MD ]

## 2016-06-26 ENCOUNTER — Encounter: Payer: Medicaid Other | Admitting: Obstetrics and Gynecology

## 2016-06-26 ENCOUNTER — Telehealth: Payer: Self-pay | Admitting: *Deleted

## 2016-06-26 NOTE — Telephone Encounter (Signed)
Patient called stating she missed her appointment d/t her father having surgery but thinks she needs to be seen regarding her incision. Will w/i with Dr Glo Herring

## 2016-07-01 ENCOUNTER — Encounter: Payer: Self-pay | Admitting: Obstetrics and Gynecology

## 2016-07-01 ENCOUNTER — Ambulatory Visit (INDEPENDENT_AMBULATORY_CARE_PROVIDER_SITE_OTHER): Payer: Medicaid Other | Admitting: Obstetrics and Gynecology

## 2016-07-01 VITALS — BP 124/82 | HR 90 | Wt 270.0 lb

## 2016-07-01 DIAGNOSIS — Z9889 Other specified postprocedural states: Secondary | ICD-10-CM

## 2016-07-01 DIAGNOSIS — Z09 Encounter for follow-up examination after completed treatment for conditions other than malignant neoplasm: Secondary | ICD-10-CM

## 2016-07-01 NOTE — Progress Notes (Signed)
Patient ID: Donna Vaughan, female   DOB: 04-23-1980, 36 y.o.   MRN: [id number]  Subjective:  Donna Vaughan is a 36 y.o. female now 5w s/p supracervical abdominal hysterectomy   Chief Complaint  Patient presents with  . Follow-up    incision recheck      Review of Systems Negative    Diet:   normal   Bowel movements : normal.  The patient is not having any pain.  Objective:  BP 124/82 (BP Location: Right Arm, Patient Position: Sitting, Cuff Size: Large)   Pulse 90   Wt 270 lb (122.5 kg)   BMI 44.93 kg/m  General:Well developed, well nourished.  No acute distress. Abdomen: Bowel sounds normal, soft, non-tender.  Incision(s):   Healing slowly, no drainage, no erythema, no hernia, no swelling, no dehiscence,   Assessment:  Post-Op 5w s/p supracervical abdominal hysterectomy  Superficial wound separation with possible superimposed infection responding to care tx.  Doing well postoperatively.   Plan:  1.Wound care discussed 2. Current medications: none 3. Activity restrictions: May shower and get in water.  4. return to work: 2-3w. 5. Follow up in 7/9-7/11   By signing my name below, I, Hansel Feinstein, attest that this documentation has been prepared under the direction and in the presence of Jonnie Kind, MD. Electronically Signed: Hansel Feinstein, ED Scribe. 07/01/16. 9:42 AM.  I personally performed the services described in this documentation, which was SCRIBED in my presence. The recorded information has been reviewed and considered accurate. It has been edited as necessary during review. Jonnie Kind, MD

## 2016-07-20 ENCOUNTER — Encounter: Payer: Medicaid Other | Admitting: Obstetrics and Gynecology

## 2016-07-22 ENCOUNTER — Ambulatory Visit (INDEPENDENT_AMBULATORY_CARE_PROVIDER_SITE_OTHER): Payer: Medicaid Other | Admitting: Obstetrics and Gynecology

## 2016-07-22 ENCOUNTER — Encounter: Payer: Self-pay | Admitting: Obstetrics and Gynecology

## 2016-07-22 VITALS — BP 130/90 | HR 78 | Wt 279.2 lb

## 2016-07-22 DIAGNOSIS — Z9889 Other specified postprocedural states: Secondary | ICD-10-CM

## 2016-07-22 DIAGNOSIS — I10 Essential (primary) hypertension: Secondary | ICD-10-CM

## 2016-07-22 DIAGNOSIS — Z09 Encounter for follow-up examination after completed treatment for conditions other than malignant neoplasm: Secondary | ICD-10-CM

## 2016-07-22 LAB — WOUND CULTURE

## 2016-07-22 MED ORDER — LISINOPRIL 10 MG PO TABS: 10.0000 mg | ORAL_TABLET | Freq: Every day | ORAL | 6 refills | Status: DC

## 2016-07-22 MED ORDER — ESTRADIOL 1 MG PO TABS: 1.0000 mg | ORAL_TABLET | Freq: Every day | ORAL | 11 refills | Status: DC

## 2016-07-22 NOTE — Progress Notes (Signed)
   Subjective:  Donna Vaughan is a 36 y.o. female now 8 weeks status post supracervical abdominal hysterectomy. Pt reports moderate hot sweats and headaches occurring. PT has no other acute complaints or associated symptoms at this time.   Review of Systems Negative   Diet: normal   Bowel movements : normal.  The patient is not having any pain.  Objective:  BP 130/90   Pulse 78   Wt 279 lb 3.2 oz (126.6 kg)   LMP 12/17/2015 (Exact Date)   BMI 46.46 kg/m  General:Well developed, well nourished.  No acute distress. Abdomen: Bowel sounds normal, soft, non-tender. Pelvic Exam: Not indicated Incision(s):   Healing well, no drainage, no erythema, no hernia, no swelling, no dehiscence,   Discussion: Discussed with pt weight loss methods including food measurement and calorie counting using apps such as MyNetDiary or My Fitness Pal. Recommended the use of measuring cups and spoons to monitor serving sizes. Encouraged adequate daily water intake, especially prior to meals to eliminate overeating. Additionally encouraged pt to become more active by taking daily walks of at least 30 minute duration, join a local gym such as the [facility] or attend water aerobics classes. Also advised pt to use pedometer on smartphone or utilize a smartband such as FitBit to keep track of daily activity.   At end of discussion, pt had opportunity to ask questions and has no further questions at this time.   Specific discussion of lifestyle changes, behavioral modifications and healthy eating habits as noted above. Greater than 50% was spent in counseling and coordination of care with the patient.  Total time greater than: 25 minutes.    Assessment:  Post-Op 8 weeks s/p supracervical abdominal hysterectomy    Pt doing well postoperatively.   Plan:  1.Wound care discussed   2. current medications. Fluid pill; Will start low dose lisinopril and estradiol hormone replacement 3. Activity restrictions: none 4.  return to work: now. 5. Follow up in 4 weeks.   By signing my name below, I, Izna Ahmed, attest that this documentation has been prepared under the direction and in the presence of Jonnie Kind, MD. Electronically Signed: Jabier Gauss, ED Scribe. 07/22/16. 1:35 PM.  I personally performed the services described in this documentation, which was SCRIBED in my presence. The recorded information has been reviewed and considered accurate. It has been edited as necessary during review. Jonnie Kind, MD

## 2016-07-22 NOTE — Patient Instructions (Signed)

## 2016-08-19 ENCOUNTER — Ambulatory Visit: Payer: Medicaid Other | Admitting: Obstetrics and Gynecology

## 2016-09-02 ENCOUNTER — Ambulatory Visit: Payer: Medicaid Other | Admitting: Obstetrics and Gynecology

## 2016-10-06 ENCOUNTER — Encounter: Payer: Self-pay | Admitting: Family Medicine

## 2016-10-06 ENCOUNTER — Ambulatory Visit (INDEPENDENT_AMBULATORY_CARE_PROVIDER_SITE_OTHER): Payer: Medicaid Other | Admitting: Family Medicine

## 2016-10-06 VITALS — BP 148/90 | HR 99 | Temp 98.4°F | Ht 65.0 in | Wt 286.0 lb

## 2016-10-06 DIAGNOSIS — Z2821 Immunization not carried out because of patient refusal: Secondary | ICD-10-CM

## 2016-10-06 DIAGNOSIS — I10 Essential (primary) hypertension: Secondary | ICD-10-CM

## 2016-10-06 DIAGNOSIS — M25562 Pain in left knee: Secondary | ICD-10-CM | POA: Insufficient documentation

## 2016-10-06 DIAGNOSIS — Z6841 Body Mass Index (BMI) 40.0 and over, adult: Secondary | ICD-10-CM | POA: Diagnosis not present

## 2016-10-06 DIAGNOSIS — M25561 Pain in right knee: Secondary | ICD-10-CM | POA: Diagnosis not present

## 2016-10-06 DIAGNOSIS — Z9109 Other allergy status, other than to drugs and biological substances: Secondary | ICD-10-CM | POA: Insufficient documentation

## 2016-10-06 MED ORDER — LISINOPRIL-HYDROCHLOROTHIAZIDE 10-12.5 MG PO TABS: 1.0000 | ORAL_TABLET | Freq: Every day | ORAL | 3 refills | Status: DC

## 2016-10-06 NOTE — Patient Instructions (Signed)
Walk every day that you are able Flu shot recommended Change BP medicine to lisinopril/HCTZ once a day  See me in 6 months Call sooner for problems
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
## 2016-12-17 ENCOUNTER — Telehealth: Payer: Self-pay

## 2016-12-17 NOTE — Telephone Encounter (Signed)
Called Donna Vaughan, states she already had her f/s. And doesn't want the prevnar.

## 2016-12-17 NOTE — Telephone Encounter (Signed)
-----   Message from Raylene Everts, MD sent at 12/16/2016  2:56 PM EST ----- Call [facility] to come in for flu and Prevnar

## 2016-12-17 NOTE — Telephone Encounter (Signed)
-----   Message from Raylene Everts, MD sent at 12/16/2016  2:56 PM EST ----- Call [facility] to come in for flu and Prevnar

## 2017-03-22 ENCOUNTER — Encounter: Payer: Self-pay | Admitting: Family Medicine

## 2017-04-05 ENCOUNTER — Ambulatory Visit: Payer: Medicaid Other | Admitting: Family Medicine

## 2018-01-30 IMAGING — CR DG ABDOMEN 1V
2 series · 2 of 2 positions shown · non-contrast
Comparison: None.

CLINICAL DATA: Check for IUD

EXAM:
ABDOMEN - 1 VIEW

[abdomen kub (1 of 2)]
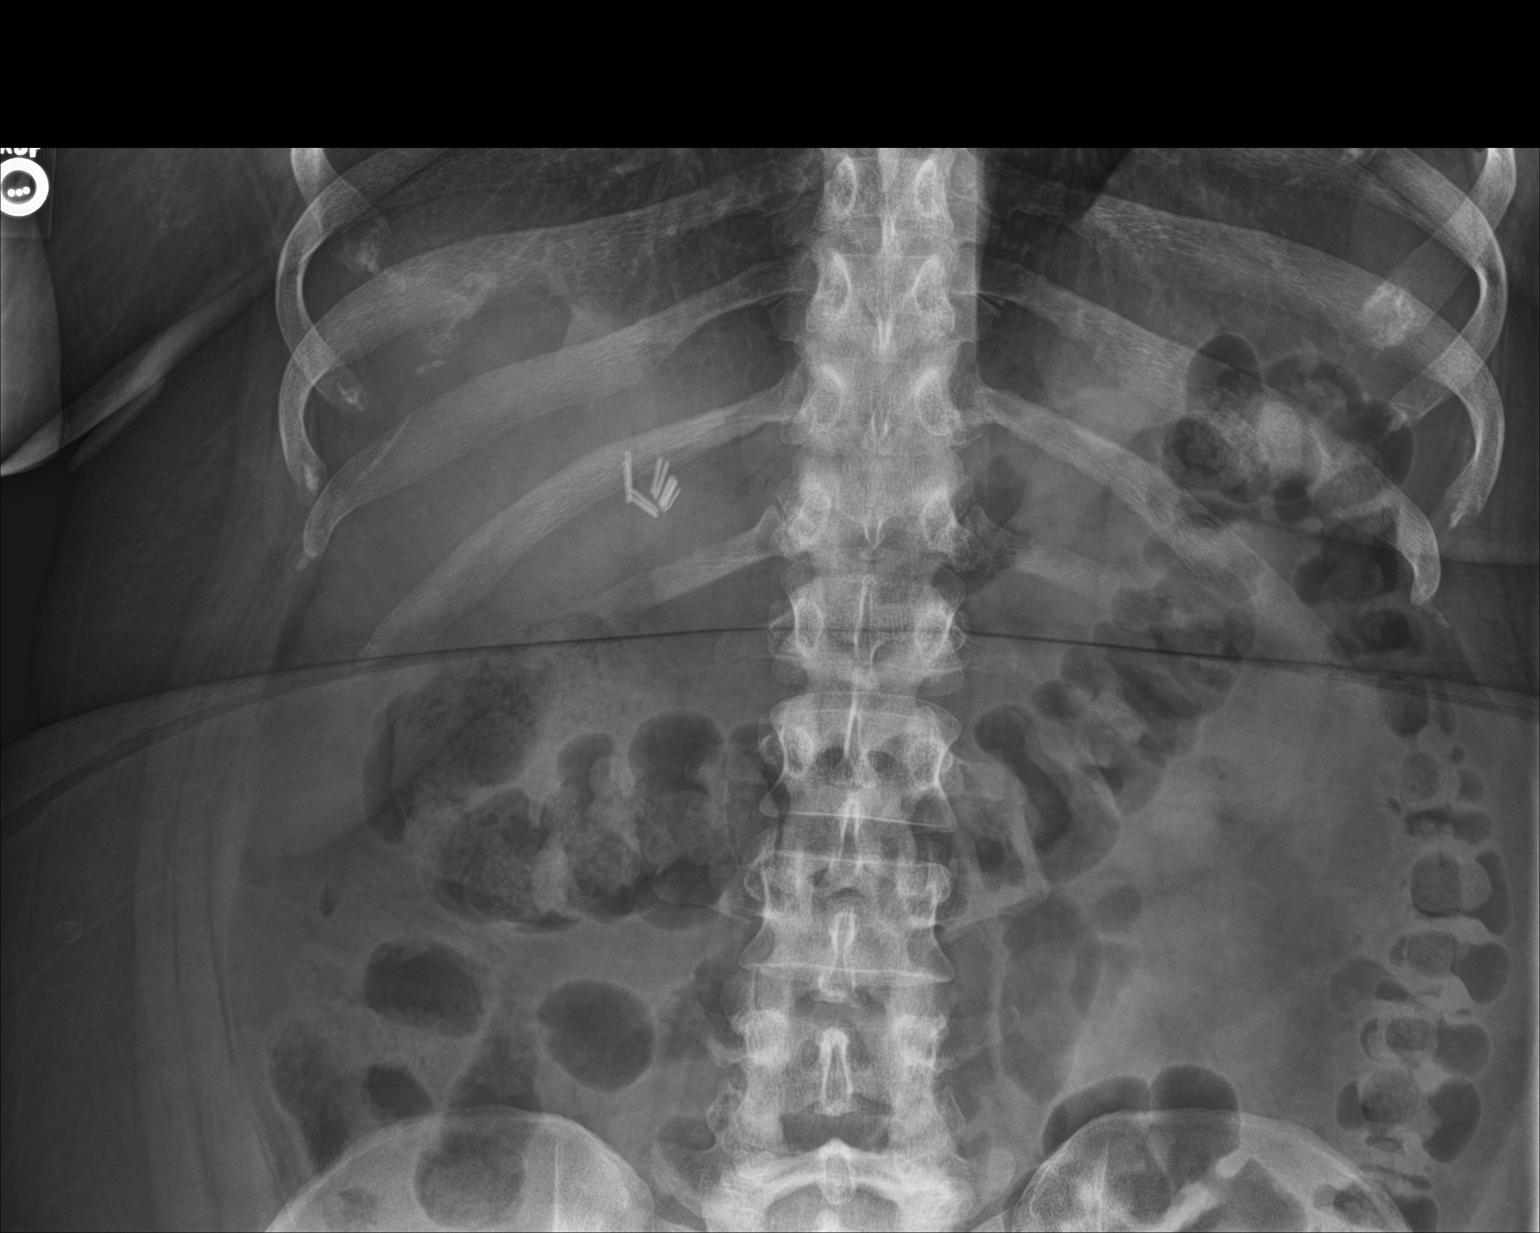

[abdomen kub (2 of 2)]
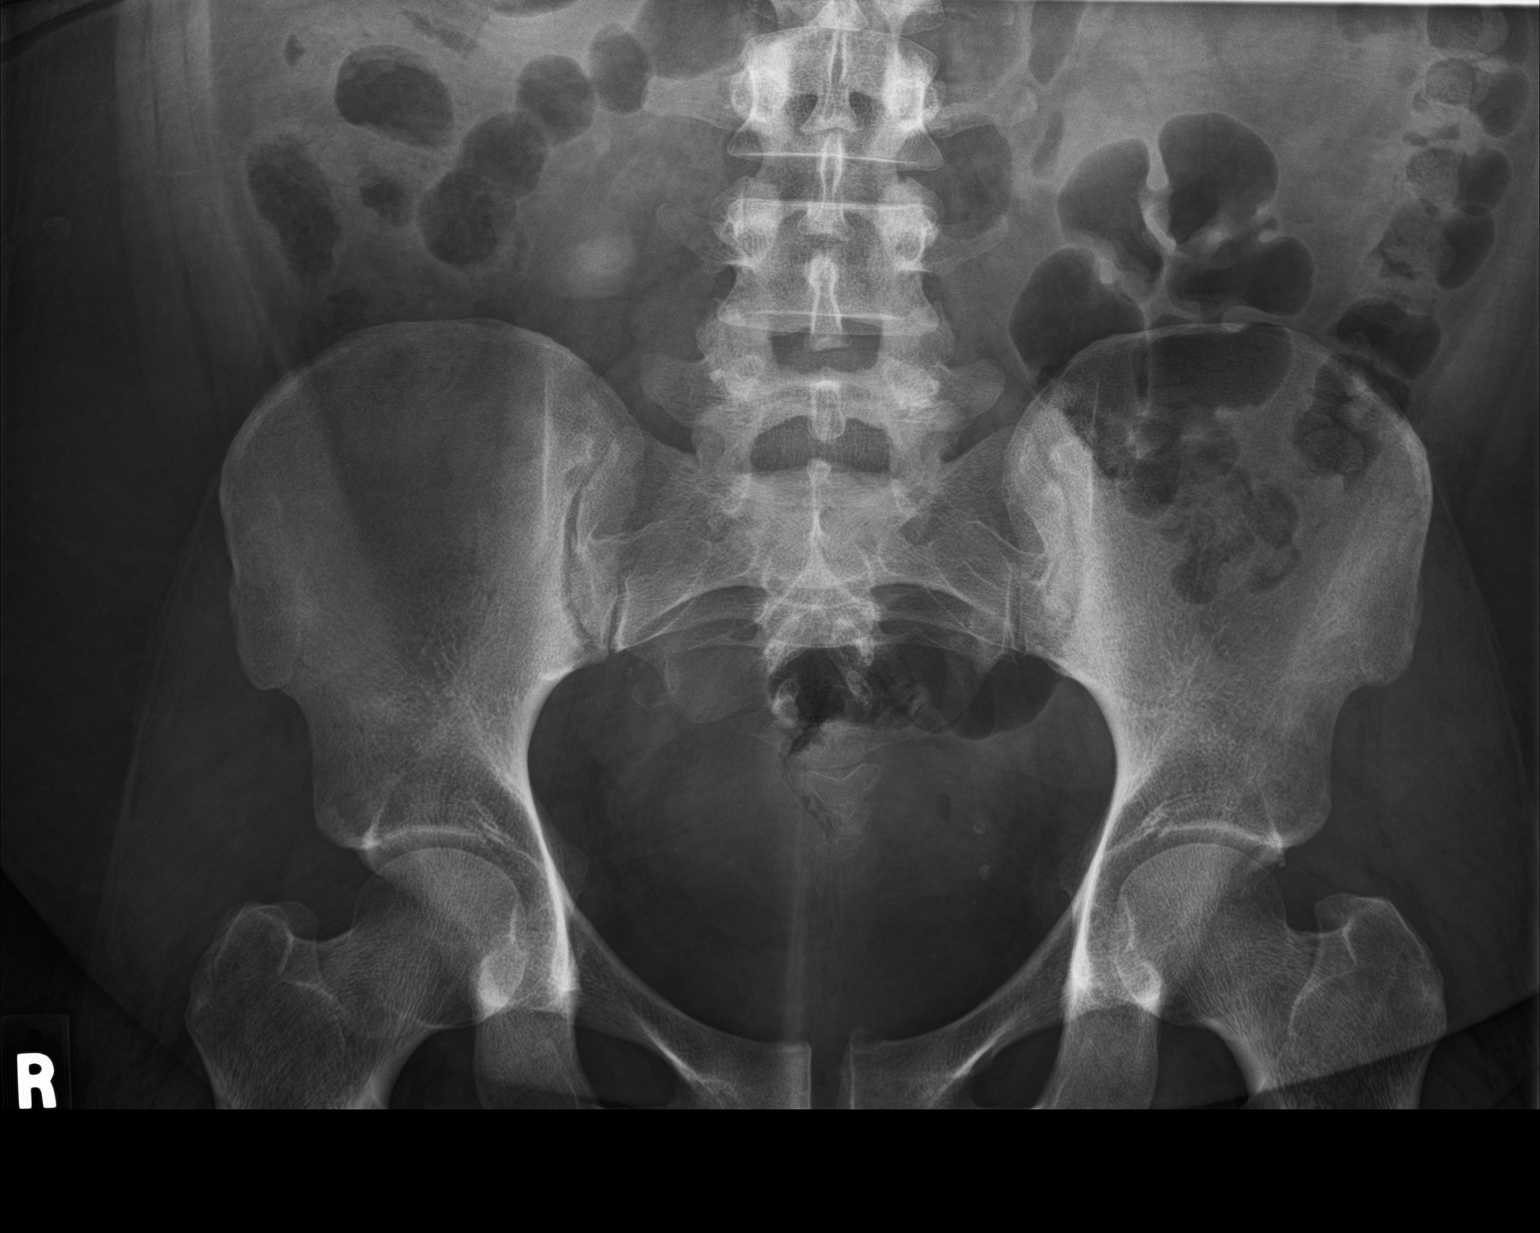

[2 of 2 positions shown; findings below may reference images not displayed]

FINDINGS: Scattered large and small bowel gas is noted. No abnormal mass or
abnormal calcifications are seen. No radiopaque foreign body to
correspond with the given clinical history of IUD is noted. No bony
abnormality is seen.
IMPRESSION: IUD not visualized.

## 2019-03-08 ENCOUNTER — Emergency Department (HOSPITAL_COMMUNITY)
Admission: EM | Admit: 2019-03-08 | Discharge: 2019-03-08 | Discharge status: Against Medical Advice | Payer: Medicaid Other | Attending: Emergency Medicine | Admitting: Emergency Medicine

## 2019-03-08 ENCOUNTER — Other Ambulatory Visit: Payer: Self-pay

## 2019-03-08 DIAGNOSIS — Z79899 Other long term (current) drug therapy: Secondary | ICD-10-CM | POA: Insufficient documentation

## 2019-03-08 DIAGNOSIS — T783XXA Angioneurotic edema, initial encounter: Secondary | ICD-10-CM | POA: Insufficient documentation

## 2019-03-08 DIAGNOSIS — T464X1A Poisoning by angiotensin-converting-enzyme inhibitors, accidental (unintentional), initial encounter: Secondary | ICD-10-CM | POA: Diagnosis not present

## 2019-03-08 DIAGNOSIS — R220 Localized swelling, mass and lump, head: Secondary | ICD-10-CM | POA: Diagnosis present

## 2019-03-08 DIAGNOSIS — T464X5A Adverse effect of angiotensin-converting-enzyme inhibitors, initial encounter: Secondary | ICD-10-CM

## 2019-03-08 DIAGNOSIS — I10 Essential (primary) hypertension: Secondary | ICD-10-CM | POA: Diagnosis not present

## 2019-03-08 MED ORDER — METHYLPREDNISOLONE SODIUM SUCC 125 MG IJ SOLR
125.0000 mg | Freq: Once | INTRAMUSCULAR | Status: AC
  Administered 2019-03-08: 125 mg via INTRAVENOUS
  Filled 2019-03-08: qty 2

## 2019-03-08 MED ORDER — DIPHENHYDRAMINE HCL 50 MG/ML IJ SOLN
25.0000 mg | Freq: Once | INTRAMUSCULAR | Status: AC
  Administered 2019-03-08: 25 mg via INTRAVENOUS
  Filled 2019-03-08: qty 1

## 2019-03-08 MED ORDER — FAMOTIDINE IN NACL 20-0.9 MG/50ML-% IV SOLN
20.0000 mg | Freq: Once | INTRAVENOUS | Status: AC
  Administered 2019-03-08: 20 mg via INTRAVENOUS
  Filled 2019-03-08: qty 50

## 2019-03-08 NOTE — Discharge Instructions (Addendum)
It was our pleasure to provide your ER care today - we hope that you feel better.  Take benadryl 25-50 mg every 6 hours as need. May make drowsy, no driving when taking.   Stop taking your ramipril.   Follow up with your doctor in the next 1-2 days for recheck - have blood pressure rechecked then, and discuss whether you need any additional new blood pressure medication.   Return to ER right away if worse, new symptoms, increased tongue or throat swelling, trouble breathing, unable to swallow, or other concern.

## 2019-03-08 NOTE — ED Triage Notes (Signed)
Pt chewed Big Red gum last night. Swallowed gum. Denies previous reaction with this gum. Woke up to oral swelling and white patches in oral mucosa. Burning sensation Denies difficulty swallowing or breathing. Benadryl at 0500.

## 2019-03-08 NOTE — ED Notes (Signed)
Pt unable to wait on discharge papers, states she had to pick up her kids. No s/s of distress. Educated on s/s of distress and when to return to ED. Verbalized understanding.

## 2019-03-08 NOTE — ED Provider Notes (Signed)
[facility] EMERGENCY DEPARTMENT Provider Note   CSN: [id number] Arrival date & time: 03/08/19  1314     History Chief Complaint  Patient presents with  . Oral Swelling    Donna Vaughan is a 39 y.o. female.  Patient with hx htn, c/o onset tongue swelling last pm. Symptoms acute onset, moderate, constant, persistent, felt swelling increased this AM. Pt took a benadryl this AM. No other new meds or change in meds. States hx similar swelling in past but it went away on own, pt unsure of cause then. No cough or sore throat. No fever or chills. No trouble breathing or swallowing. Patient is noted be on ace inhibitor, denies being dx with angioedema previously, no fam hx angioedema.   The history is provided by the patient.       Past Medical History:  Diagnosis Date  . Anemia   . Anxiety   . Depression   . Encounter for sterilization 12/31/2015  . Fibroid uterus 08/04/2014   Multiple fibroids, largest 5.4x4.3x3.7 (ant) @ 21wks  . Fibroids   . Headache   . Hypertension   . Morbid obesity ([place])   . Pregnant   . Prior pregnancy with fetal demise 11/12/2015   10/30/15: 23wks, tight nuchal, IUGR, CHTN  . Vitamin D insufficiency     Patient Active Problem List   Diagnosis Date Noted  . Environmental allergies 10/06/2016  . Bilateral anterior knee pain 10/06/2016  . Immunization refused 10/06/2016  . Status post abdominal supracervical subtotal hysterectomy 05/26/2016  . OBESITY 05/30/2007  . Chronic hypertension 05/30/2007    Past Surgical History:  Procedure Laterality Date  . ABDOMINAL HYSTERECTOMY     partial  . ADENOIDECTOMY    . CHOLECYSTECTOMY    . DILITATION & CURRETTAGE/HYSTROSCOPY WITH NOVASURE ABLATION N/A 12/31/2015   Procedure: DILATATION & CURETTAGE/HYSTEROSCOPY WITH ENDOMETRIAL  ABLATION;  Surgeon: Jonnie Kind, MD;  Location: [facility];  Service: Gynecology;  Laterality: N/A;  . FOOT SURGERY Left    removal of foreign body  . LAPAROSCOPIC BILATERAL  SALPINGECTOMY Bilateral 12/31/2015   Procedure: LAPAROSCOPIC BILATERAL SALPINGECTOMY;  Surgeon: Jonnie Kind, MD;  Location: [facility];  Service: Gynecology;  Laterality: Bilateral;  . SUPRACERVICAL ABDOMINAL HYSTERECTOMY N/A 05/26/2016   Procedure: ABDOMINAL SUPRACERVICAL HYSTERECTOMY;  Surgeon: Jonnie Kind, MD;  Location: [facility];  Service: Gynecology;  Laterality: N/A;  . TONSILLECTOMY       OB History    Gravida  5   Para  4   Term  3   Preterm  1   AB  1   Living  3     SAB  1   TAB      Ectopic      Multiple  0   Live Births  3           Family History  Problem Relation Age of Onset  . Hypertension Brother   . Asthma Son   . Diabetes Maternal Uncle   . Stroke Maternal Grandfather   . Hypertension Paternal Grandmother   . Heart disease Paternal Grandmother        CHF  . Cancer Paternal Grandmother        ovarian  . COPD Son        bronchitis    Social History   Tobacco Use  . Smoking status: Never Smoker  . Smokeless tobacco: Never Used  Substance Use Topics  . Alcohol use: No  . Drug use: No  Home Medications Prior to Admission medications   Medication Sig Start Date End Date Taking? Authorizing Provider  lisinopril-hydrochlorothiazide (PRINZIDE,ZESTORETIC) 10-12.5 MG tablet Take 1 tablet by mouth daily. 10/06/16   Raylene Everts, MD    Allergies    Patient has no known allergies.  Review of Systems   Review of Systems  Constitutional: Negative for fever.  HENT: Negative for sore throat.   Eyes: Negative for redness.  Respiratory: Negative for cough and shortness of breath.   Cardiovascular: Negative for chest pain.  Gastrointestinal: Negative for abdominal pain.  Genitourinary: Negative for flank pain.  Musculoskeletal: Negative for neck pain.  Skin: Negative for rash.  Neurological: Negative for headaches.  Hematological: Does not bruise/bleed easily.  Psychiatric/Behavioral: Negative for confusion.    Physical  Exam Updated Vital Signs Ht 1.651 m (5\' 5" )   Wt 128.8 kg   LMP 12/17/2015 (Exact Date)   BMI 47.26 kg/m   Physical Exam Vitals and nursing note reviewed.  Constitutional:      Appearance: Normal appearance. She is well-developed.  HENT:     Head: Atraumatic.     Nose: Nose normal.     Mouth/Throat:     Mouth: Mucous membranes are moist.     Comments: Moderate tongue swelling, diffuse. Pharynx/uvula not grossly swollen.  Eyes:     General: No scleral icterus.    Conjunctiva/sclera: Conjunctivae normal.  Neck:     Trachea: No tracheal deviation.     Comments: Trachea midline. No neck mass/swelling noted. No tenderness.  Cardiovascular:     Rate and Rhythm: Normal rate and regular rhythm.     Pulses: Normal pulses.     Heart sounds: Normal heart sounds. No murmur. No friction rub. No gallop.   Pulmonary:     Effort: Pulmonary effort is normal. No respiratory distress.     Breath sounds: Normal breath sounds.  Abdominal:     General: Bowel sounds are normal. There is no distension.     Palpations: Abdomen is soft.     Tenderness: There is no abdominal tenderness.  Genitourinary:    Comments: No cva tenderness.  Musculoskeletal:        General: No swelling.     Cervical back: Normal range of motion and neck supple. No rigidity. No muscular tenderness.     Right lower leg: No edema.     Left lower leg: No edema.  Skin:    General: Skin is warm and dry.     Findings: No rash.  Neurological:     Mental Status: She is alert.     Comments: Alert, speech normal.   Psychiatric:        Mood and Affect: Mood normal.     ED Results / Procedures / Treatments   Labs (all labs ordered are listed, but only abnormal results are displayed) Labs Reviewed - No data to display  EKG None  Radiology No results found.  Procedures Procedures (including critical care time)  Medications Ordered in ED Medications  methylPREDNISolone sodium succinate (SOLU-MEDROL) 125 mg/2 mL  injection 125 mg (has no administration in time range)  famotidine (PEPCID) IVPB 20 mg premix (has no administration in time range)  diphenhydrAMINE (BENADRYL) injection 25 mg (has no administration in time range)    ED Course  I have reviewed the triage vital signs and the nursing notes.  Pertinent labs & imaging results that were available during my care of the patient were reviewed by me and considered in my  medical decision making (see chart for details).    MDM Rules/Calculators/A&P                      Exam, and pt's meds, c/w angioedema.   Reviewed nursing notes and prior charts for additional history.   Solumedrol iv. Benadryl iv, pepcid iv. Continuous pulse ox and monitor.   Recheck pt, slightly improved from prior. No increased wob. No trouble swallowing. No stridor.   Signed out to Dr Rogene Houston to reassess, and if improved, may d/c to home then, stopping ace.     Final Clinical Impression(s) / ED Diagnoses Final diagnoses:  None    Rx / DC Orders ED Discharge Orders    None       Lajean Saver, MD 03/08/19 [phone]

## 2019-08-23 ENCOUNTER — Telehealth: Payer: Self-pay | Admitting: Obstetrics and Gynecology

## 2019-08-23 NOTE — Telephone Encounter (Signed)
Pt has appt for 09-06-19 with Ferg wants to get in earlier. I explained we do not have any openings til then and we are having to r/s pt on the schedule now just to get some of our OB patients on the schedule that have to be seen next week. She insisted that a nurse call her back.

## 2019-08-23 NOTE — Telephone Encounter (Signed)
Telephoned patient at home number and advised patient that August 25 was first available. If any questions, could return call. Patient called back and stated when wiping will see some dark blood. Patient is not having any pain. Patient has appointment scheduled and if symptoms get worse to call us back. Patient voiced understanding.

## 2019-09-06 ENCOUNTER — Ambulatory Visit: Payer: Medicaid Other | Admitting: Obstetrics and Gynecology

## 2021-11-25 ENCOUNTER — Other Ambulatory Visit: Payer: Self-pay | Admitting: Internal Medicine

## 2021-11-25 DIAGNOSIS — Z1231 Encounter for screening mammogram for malignant neoplasm of breast: Secondary | ICD-10-CM

## 2022-01-29 ENCOUNTER — Ambulatory Visit: Payer: Medicaid Other | Admitting: Family Medicine

## 2022-01-29 ENCOUNTER — Encounter: Payer: Self-pay | Admitting: Family Medicine

## 2022-01-29 VITALS — BP 114/89 | HR 107 | Ht 65.0 in | Wt 287.0 lb

## 2022-01-29 DIAGNOSIS — R7301 Impaired fasting glucose: Secondary | ICD-10-CM | POA: Diagnosis not present

## 2022-01-29 DIAGNOSIS — E785 Hyperlipidemia, unspecified: Secondary | ICD-10-CM

## 2022-01-29 DIAGNOSIS — Z713 Dietary counseling and surveillance: Secondary | ICD-10-CM | POA: Diagnosis not present

## 2022-01-29 DIAGNOSIS — I10 Essential (primary) hypertension: Secondary | ICD-10-CM

## 2022-01-29 DIAGNOSIS — Z6841 Body Mass Index (BMI) 40.0 and over, adult: Secondary | ICD-10-CM

## 2022-01-29 DIAGNOSIS — Z1159 Encounter for screening for other viral diseases: Secondary | ICD-10-CM

## 2022-01-29 DIAGNOSIS — E039 Hypothyroidism, unspecified: Secondary | ICD-10-CM

## 2022-01-29 NOTE — Progress Notes (Signed)
New Patient Office Visit   Subjective   Patient ID: Donna Vaughan, female    DOB: February 19, 1980  Age: 42 y.o. MRN: [id number]  CC:  Chief Complaint  Patient presents with   Establish Care   Weight Loss    Patient wants to discuss starting mounjaro.    Back Pain    Patient complains of lower R back pain shooting down her leg.     HPI Donna Vaughan presents to establish care. She  has a past medical history of Anemia, Anxiety, Depression, Encounter for sterilization (12/31/2015), Fibroid uterus (08/04/2014), Fibroids, Headache, Hypertension, Morbid obesity ([place]), Pregnant, Prior pregnancy with fetal demise (11/12/2015), and Vitamin D insufficiency.  Patient presents to the clinic for weight loss management.She reports wanting to improving  health as reasons for wanting to lose weight.Successful weight loss techniques attempted, self-directed dieting. Unsuccessful weight loss techniques attempted semaglutide injections for a period of one month. Patient denies having an exercise routine.      Outpatient Encounter Medications as of 01/29/2022  Medication Sig   carvedilol (COREG) 12.5 MG tablet Take 12.5 mg by mouth daily at 2 PM.   ibuprofen (ADVIL) 800 MG tablet Take 800 mg by mouth every 8 (eight) hours as needed.   hydrochlorothiazide (HYDRODIURIL) 25 MG tablet Take 25 mg by mouth daily. (Patient not taking: Reported on 01/29/2022)   [DISCONTINUED] ramipril (ALTACE) 5 MG capsule Take 5 mg by mouth daily.   No facility-administered encounter medications on file as of 01/29/2022.    Past Surgical History:  Procedure Laterality Date   ABDOMINAL HYSTERECTOMY     partial   ADENOIDECTOMY     CHOLECYSTECTOMY     DILITATION & CURRETTAGE/HYSTROSCOPY WITH NOVASURE ABLATION N/A 12/31/2015   Procedure: DILATATION & CURETTAGE/HYSTEROSCOPY WITH ENDOMETRIAL  ABLATION;  Surgeon: Jonnie Kind, MD;  Location: [facility];  Service: Gynecology;  Laterality: N/A;   FOOT SURGERY Left    removal  of foreign body   LAPAROSCOPIC BILATERAL SALPINGECTOMY Bilateral 12/31/2015   Procedure: LAPAROSCOPIC BILATERAL SALPINGECTOMY;  Surgeon: Jonnie Kind, MD;  Location: [facility];  Service: Gynecology;  Laterality: Bilateral;   SUPRACERVICAL ABDOMINAL HYSTERECTOMY N/A 05/26/2016   Procedure: ABDOMINAL SUPRACERVICAL HYSTERECTOMY;  Surgeon: Jonnie Kind, MD;  Location: [facility];  Service: Gynecology;  Laterality: N/A;   TONSILLECTOMY      Review of Systems  Constitutional:  Negative for chills and fever.  Respiratory:  Negative for shortness of breath.   Cardiovascular:  Negative for chest pain.  Gastrointestinal:  Negative for heartburn, nausea and vomiting.  Genitourinary:  Negative for dysuria and urgency.  Neurological:  Negative for dizziness and headaches.  Psychiatric/Behavioral:  Negative for depression.       Objective    BP 114/89   Pulse (!) 107   Ht '5\' 5"'$  (1.651 m)   Wt 287 lb (130.2 kg)   LMP 12/17/2015 (Exact Date)   SpO2 96%   BMI 47.76 kg/m   Physical Exam Constitutional:      Appearance: She is obese.  Cardiovascular:     Rate and Rhythm: Normal rate and regular rhythm.     Pulses: Normal pulses.     Heart sounds: Normal heart sounds.  Pulmonary:     Effort: Pulmonary effort is normal.  Abdominal:     General: Bowel sounds are normal.     Palpations: Abdomen is soft.  Skin:    General: Skin is warm and dry.     Capillary Refill: Capillary refill  takes less than 2 seconds.  Neurological:     General: No focal deficit present.     Mental Status: She is alert.  Psychiatric:        Mood and Affect: Mood normal.       Assessment & Plan:  Weight loss counseling, encounter for  IFG (impaired fasting glucose) -     Hemoglobin A1c -     Lipid panel  Primary hypertension -     CMP14+EGFR -     Microalbumin / creatinine urine ratio  Hyperlipidemia, unspecified hyperlipidemia type -     Lipid panel -     CBC with  Differential/Platelet  Hypothyroidism, unspecified type -     TSH + free T4  Need for hepatitis C screening test -     Hepatitis C antibody  Class 3 severe obesity due to excess calories with body mass index (BMI) of 45.0 to 49.9 in adult, unspecified whether serious comorbidity present [facility]) Assessment & Plan: Patient BMI 47.76 I encouraged to start lifestyle modifications follow diet low in saturated fat, reduce dietary salt intake, avoid fatty foods, maintain an exercise routine 3 to 5 days a week for a minimum total of 150 minutes.  Follow up in 4 weeks for weight loss management      Return in about 4 weeks (around 02/26/2022) for Weight Loss Mangment.   Renard Hamper Ria Comment, FNP

## 2022-01-29 NOTE — Patient Instructions (Signed)
Follow up in 4 weeks for weight loss management

## 2022-01-31 LAB — CMP14+EGFR
ALT: 12 IU/L (ref 0–32)
AST: 12 IU/L (ref 0–40)
Albumin/Globulin Ratio: 1.4 (ref 1.2–2.2)
Albumin: 4.3 g/dL (ref 3.9–4.9)
Alkaline Phosphatase: 89 IU/L (ref 44–121)
BUN/Creatinine Ratio: 10 (ref 9–23)
BUN: 9 mg/dL (ref 6–24)
Bilirubin Total: 0.3 mg/dL (ref 0.0–1.2)
CO2: 21 mmol/L (ref 20–29)
Calcium: 9.3 mg/dL (ref 8.7–10.2)
Chloride: 103 mmol/L (ref 96–106)
Creatinine, Ser: 0.89 mg/dL (ref 0.57–1.00)
Globulin, Total: 3.1 g/dL (ref 1.5–4.5)
Glucose: 128 mg/dL — ABNORMAL HIGH (ref 70–99)
Potassium: 4 mmol/L (ref 3.5–5.2)
Sodium: 143 mmol/L (ref 134–144)
Total Protein: 7.4 g/dL (ref 6.0–8.5)
eGFR: 83 mL/min/{1.73_m2} (ref >59)

## 2022-01-31 LAB — CBC WITH DIFFERENTIAL/PLATELET
Basophils Absolute: 0.1 10*3/uL (ref 0.0–0.2)
Basos: 1 %
EOS (ABSOLUTE): 0.1 10*3/uL (ref 0.0–0.4)
Eos: 1 %
Hematocrit: 39.9 % (ref 34.0–46.6)
Hemoglobin: 13.1 g/dL (ref 11.1–15.9)
Immature Grans (Abs): 0 10*3/uL (ref 0.0–0.1)
Immature Granulocytes: 0 %
Lymphocytes Absolute: 2.7 10*3/uL (ref 0.7–3.1)
Lymphs: 28 %
MCH: 28.7 pg (ref 26.6–33.0)
MCHC: 32.8 g/dL (ref 31.5–35.7)
MCV: 87 fL (ref 79–97)
Monocytes Absolute: 0.3 10*3/uL (ref 0.1–0.9)
Monocytes: 3 %
Neutrophils Absolute: 6.6 10*3/uL (ref 1.4–7.0)
Neutrophils: 67 %
Platelets: 388 10*3/uL (ref 150–450)
RBC: 4.57 x10E6/uL (ref 3.77–5.28)
RDW: 13.3 % (ref 11.7–15.4)
WBC: 9.9 10*3/uL (ref 3.4–10.8)

## 2022-01-31 LAB — TSH+FREE T4
Free T4: 1.36 ng/dL (ref 0.82–1.77)
TSH: 1.55 u[IU]/mL (ref 0.450–4.500)

## 2022-01-31 LAB — LIPID PANEL
Chol/HDL Ratio: 2.8 ratio (ref 0.0–4.4)
Cholesterol, Total: 177 mg/dL (ref 100–199)
HDL: 63 mg/dL (ref >39)
LDL Chol Calc (NIH): 84 mg/dL (ref 0–99)
Triglycerides: 177 mg/dL — ABNORMAL HIGH (ref 0–149)
VLDL Cholesterol Cal: 30 mg/dL (ref 5–40)

## 2022-01-31 LAB — HEMOGLOBIN A1C
Est. average glucose Bld gHb Est-mCnc: 120 mg/dL
Hgb A1c MFr Bld: 5.8 % — ABNORMAL HIGH (ref 4.8–5.6)

## 2022-01-31 LAB — MICROALBUMIN / CREATININE URINE RATIO
Creatinine, Urine: 161.5 mg/dL
Microalb/Creat Ratio: 13 mg/g creat (ref 0–29)
Microalbumin, Urine: 20.6 ug/mL

## 2022-01-31 LAB — HEPATITIS C ANTIBODY: Hep C Virus Ab: NONREACTIVE

## 2022-02-01 DIAGNOSIS — E6609 Other obesity due to excess calories: Secondary | ICD-10-CM | POA: Insufficient documentation

## 2022-02-01 NOTE — Assessment & Plan Note (Signed)
Patient BMI 47.76 I encouraged to start lifestyle modifications follow diet low in saturated fat, reduce dietary salt intake, avoid fatty foods, maintain an exercise routine 3 to 5 days a week for a minimum total of 150 minutes.  Follow up in 4 weeks for weight loss management

## 2022-02-16 ENCOUNTER — Other Ambulatory Visit: Payer: Self-pay

## 2022-02-16 MED ORDER — MOUNJARO 2.5 MG/0.5ML ~~LOC~~ SOAJ: 2.5000 mg | SUBCUTANEOUS | 2 refills | Status: DC

## 2022-02-26 ENCOUNTER — Encounter: Payer: Self-pay | Admitting: Family Medicine

## 2022-02-26 ENCOUNTER — Ambulatory Visit: Payer: Medicaid Other | Admitting: Family Medicine

## 2022-02-26 VITALS — BP 136/90 | HR 91 | Ht 65.0 in | Wt 275.0 lb

## 2022-02-26 DIAGNOSIS — Z6841 Body Mass Index (BMI) 40.0 and over, adult: Secondary | ICD-10-CM

## 2022-02-26 DIAGNOSIS — Z713 Dietary counseling and surveillance: Secondary | ICD-10-CM

## 2022-02-26 NOTE — Patient Instructions (Addendum)
It was pleasure meeting with you today. Follow up with your primary health provider if any health concerns arises. Follow up in 4 weeks for weight loss management

## 2022-02-26 NOTE — Progress Notes (Addendum)
Patient Office Visit   Subjective   Patient ID: Donna Vaughan, female    DOB: [birth year]  Age: 42 y.o. MRN: [id number]  CC:  Chief Complaint  Patient presents with   Weight Loss    Patient is here for weight loss management. Is taking mounjaro, with no issues or complaints.     HPI Donna Vaughan 42 year old female, presents to clinic for weight loss management . She  has a past medical history of Anemia, Anxiety, Depression, Encounter for sterilization (12/31/2015), Fibroid uterus (08/04/2014), Fibroids, Headache, Hypertension, Morbid obesity ([place]), Pregnant, Prior pregnancy with fetal demise (11/12/2015), and Vitamin D insufficiency.  Patient reports she has implemented the plan of care for weight loss as per our last visit. For healthy eating habits for breakfast she has 30 grams of protein shakes, two slices bacon two boiled eggs, For lunch her plate included vegetables, chicken and ham, Dinner includes chicken salad with vegetables. Patient reports limiting her sugar intake and fatty foods. For physical activity she walks 1 mile a day.        Outpatient Encounter Medications as of 02/26/2022  Medication Sig   carvedilol (COREG) 12.5 MG tablet Take 12.5 mg by mouth daily at 2 PM.   hydrochlorothiazide (HYDRODIURIL) 25 MG tablet Take 25 mg by mouth daily.   ibuprofen (ADVIL) 800 MG tablet Take 800 mg by mouth every 8 (eight) hours as needed.   tirzepatide [facility]) 2.5 MG/0.5ML Pen Inject 2.5 mg into the skin once a week.   No facility-administered encounter medications on file as of 02/26/2022.    Past Surgical History:  Procedure Laterality Date   ABDOMINAL HYSTERECTOMY     partial   ADENOIDECTOMY     CHOLECYSTECTOMY     DILITATION & CURRETTAGE/HYSTROSCOPY WITH NOVASURE ABLATION N/A 12/31/2015   Procedure: DILATATION & CURETTAGE/HYSTEROSCOPY WITH ENDOMETRIAL  ABLATION;  Surgeon: Jonnie Kind, MD;  Location: [facility];  Service: Gynecology;  Laterality: N/A;   FOOT  SURGERY Left    removal of foreign body   LAPAROSCOPIC BILATERAL SALPINGECTOMY Bilateral 12/31/2015   Procedure: LAPAROSCOPIC BILATERAL SALPINGECTOMY;  Surgeon: Jonnie Kind, MD;  Location: [facility];  Service: Gynecology;  Laterality: Bilateral;   SUPRACERVICAL ABDOMINAL HYSTERECTOMY N/A 05/26/2016   Procedure: ABDOMINAL SUPRACERVICAL HYSTERECTOMY;  Surgeon: Jonnie Kind, MD;  Location: [facility];  Service: Gynecology;  Laterality: N/A;   TONSILLECTOMY      Review of Systems  Constitutional:  Negative for chills and fever.  HENT:  Negative for ear pain.   Eyes:  Negative for blurred vision.  Respiratory:  Negative for shortness of breath.   Cardiovascular:  Negative for chest pain.  Gastrointestinal:  Negative for abdominal pain.  Genitourinary:  Negative for dysuria.  Musculoskeletal:  Negative for myalgias.  Skin:  Negative for rash.  Neurological:  Negative for dizziness and headaches.      Objective    BP (!) 136/90   Pulse 91   Ht 5' 5"$  (1.651 m)   Wt 275 lb (124.7 kg)   LMP 12/17/2015 (Exact Date)   SpO2 97%   BMI 45.76 kg/m   Physical Exam Constitutional:      Appearance: She is obese.  Cardiovascular:     Rate and Rhythm: Normal rate and regular rhythm.     Pulses: Normal pulses.     Heart sounds: Normal heart sounds.  Pulmonary:     Effort: Pulmonary effort is normal.     Breath sounds: Normal breath sounds.  Abdominal:     General: Bowel sounds are normal.     Palpations: Abdomen is soft. There is no mass.  Skin:    General: Skin is warm and dry.     Capillary Refill: Capillary refill takes less than 2 seconds.  Neurological:     Mental Status: She is alert and oriented to person, place, and time.  Psychiatric:        Mood and Affect: Mood normal.       Assessment & Plan:  Class 3 severe obesity due to excess calories with body mass index (BMI) of 45.0 to 49.9 in adult, unspecified whether serious comorbidity present [facility]) Assessment &  Plan: Weight loss since our last visit -12 lbs, Current weight 275 lb BMI 45.76 Patient stated her other provider started her on Mounjaro 2.5 mg weekly injections one month ago. Patient hemoglobin A1c 5.8 Follow up in 4 weeks for weight loss management No medication interventions in this visit     Return in about 1 month (around 03/27/2022) for Weight Loss Mangment .   Renard Hamper Ria Comment, FNP

## 2022-02-28 NOTE — Assessment & Plan Note (Addendum)
Weight loss since our last visit -12 lbs, Current weight 275 lb BMI 45.76 Patient stated her other provider started her on Mounjaro 2.5 mg weekly injections one month ago. Patient hemoglobin A1c 5.8 Follow up in 4 weeks for weight loss management No medication interventions in this visit

## 2022-03-02 ENCOUNTER — Other Ambulatory Visit: Payer: Self-pay | Admitting: Family

## 2022-03-02 MED ORDER — MOUNJARO 5 MG/0.5ML ~~LOC~~ SOAJ: 5.0000 mg | SUBCUTANEOUS | 0 refills | Status: DC

## 2022-03-29 ENCOUNTER — Other Ambulatory Visit: Payer: Self-pay | Admitting: Family

## 2022-04-02 ENCOUNTER — Encounter: Payer: Self-pay | Admitting: Family Medicine

## 2022-04-02 ENCOUNTER — Ambulatory Visit: Payer: Medicaid Other | Admitting: Family Medicine

## 2022-04-08 ENCOUNTER — Ambulatory Visit: Payer: Medicaid Other | Admitting: Family Medicine

## 2022-04-08 ENCOUNTER — Encounter: Payer: Self-pay | Admitting: Family Medicine

## 2022-04-15 ENCOUNTER — Telehealth: Payer: Self-pay

## 2022-04-15 MED ORDER — MOUNJARO 7.5 MG/0.5ML ~~LOC~~ SOAJ: 7.5000 mg | SUBCUTANEOUS | 2 refills | Status: DC

## 2022-04-15 NOTE — Addendum Note (Signed)
Addended by: Georgian Co on: 04/15/2022 09:22 PM   Modules accepted: Orders

## 2022-04-15 NOTE — Telephone Encounter (Signed)
Patient called requesting the next dose in her mounjaro rx, she is curently taking the 5 mg this will need to go to [facility] in [place] 

## 2022-05-08 ENCOUNTER — Ambulatory Visit: Payer: Medicaid Other | Admitting: Family

## 2022-05-11 ENCOUNTER — Ambulatory Visit (INDEPENDENT_AMBULATORY_CARE_PROVIDER_SITE_OTHER): Payer: Medicaid Other | Admitting: Family

## 2022-05-11 ENCOUNTER — Encounter: Payer: Self-pay | Admitting: Family

## 2022-05-11 VITALS — BP 128/80 | HR 92 | Ht 65.0 in | Wt 267.0 lb

## 2022-05-11 DIAGNOSIS — N760 Acute vaginitis: Secondary | ICD-10-CM | POA: Diagnosis not present

## 2022-05-11 MED ORDER — METRONIDAZOLE 500 MG PO TABS: 500.0000 mg | ORAL_TABLET | Freq: Two times a day (BID) | ORAL | 0 refills | Status: DC

## 2022-05-14 ENCOUNTER — Encounter: Payer: Self-pay | Admitting: Family

## 2022-05-14 LAB — NUSWAB VAGINITIS PLUS (VG+)
Atopobium vaginae: HIGH Score — AB
BVAB 2: HIGH Score — AB
Candida albicans, NAA: POSITIVE — AB
Candida glabrata, NAA: NEGATIVE
Chlamydia trachomatis, NAA: POSITIVE — AB
Megasphaera 1: HIGH Score — AB
Neisseria gonorrhoeae, NAA: NEGATIVE
Trich vag by NAA: NEGATIVE

## 2022-05-14 MED ORDER — FLUCONAZOLE 150 MG PO TABS: 150.0000 mg | ORAL_TABLET | Freq: Every day | ORAL | 0 refills | Status: AC

## 2022-05-14 MED ORDER — DOXYCYCLINE HYCLATE 100 MG PO CAPS: 100.0000 mg | ORAL_CAPSULE | Freq: Two times a day (BID) | ORAL | 0 refills | Status: DC

## 2022-05-15 ENCOUNTER — Encounter: Payer: Self-pay | Admitting: Family

## 2022-05-15 NOTE — Progress Notes (Signed)
Established Patient Office Visit  Subjective:  Patient ID: Donna Vaughan, female    DOB: 31-Dec-1980  Age: 42 y.o. MRN: [id number]  Chief Complaint  Patient presents with   Follow-up    Follow up    Patient is here today with what she thinks is BV.  She says that she has been having some discomfort and that she has noticed an odor as well. '  Asks if we can increase the dose of her Mounjaro.   No other concerns at this time.   Past Medical History:  Diagnosis Date   Anemia    Anxiety    Depression    Encounter for sterilization 12/31/2015   Fibroid uterus 08/04/2014   Multiple fibroids, largest 5.4x4.3x3.7 (ant) @ 21wks   Fibroids    Headache    Hypertension    Morbid obesity ([facility])    Pregnant    Prior pregnancy with fetal demise 11/12/2015   10/30/15: 23wks, tight nuchal, IUGR, CHTN   Vitamin D insufficiency     Past Surgical History:  Procedure Laterality Date   ABDOMINAL HYSTERECTOMY     partial   ADENOIDECTOMY     CHOLECYSTECTOMY     DILITATION & CURRETTAGE/HYSTROSCOPY WITH NOVASURE ABLATION N/A 12/31/2015   Procedure: DILATATION & CURETTAGE/HYSTEROSCOPY WITH ENDOMETRIAL  ABLATION;  Surgeon: Tilda Burrow, MD;  Location: [facility];  Service: Gynecology;  Laterality: N/A;   FOOT SURGERY Left    removal of foreign body   LAPAROSCOPIC BILATERAL SALPINGECTOMY Bilateral 12/31/2015   Procedure: LAPAROSCOPIC BILATERAL SALPINGECTOMY;  Surgeon: Tilda Burrow, MD;  Location: [facility];  Service: Gynecology;  Laterality: Bilateral;   SUPRACERVICAL ABDOMINAL HYSTERECTOMY N/A 05/26/2016   Procedure: ABDOMINAL SUPRACERVICAL HYSTERECTOMY;  Surgeon: Tilda Burrow, MD;  Location: [facility];  Service: Gynecology;  Laterality: N/A;   TONSILLECTOMY      Social History   Socioeconomic History   Marital status: Single    Spouse name: Tinnie Gens   Number of children: 3   Years of education: 14   Highest education level: Not on file  Occupational History   Occupation: tech  support    Comment: [employer]  Tobacco Use   Smoking status: Never   Smokeless tobacco: Never  Vaping Use   Vaping Use: Never used  Substance and Sexual Activity   Alcohol use: No   Drug use: No   Sexual activity: Yes    Partners: Male    Birth control/protection: Surgical  Other Topics Concern   Not on file  Social History Narrative   Some college   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   Lives at 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   Social Determinants of Corporate investment banker Strain: Not on file  Food Insecurity: Not on file  Transportation Needs: Not on file  Physical Activity: Not on file  Stress: Not on file  Social Connections: Not on file  Intimate Partner Violence: Not on file    Family History  Problem Relation Age of Onset   Hypertension Brother    Asthma Son    Diabetes Maternal Uncle    Stroke Maternal Grandfather    Hypertension Paternal Grandmother    Heart disease Paternal Grandmother        CHF   Cancer Paternal Grandmother        ovarian   COPD Son        bronchitis    Allergies  Allergen Reactions   Ramipril Anaphylaxis  Review of Systems  Genitourinary:  Positive for dysuria.       Vaginal discharge, odor, itching.  All other systems reviewed and are negative.      Objective:   BP 128/80   Pulse 92   Ht 5\' 5"  (1.651 m)   Wt 267 lb (121.1 kg)   LMP 12/17/2015 (Exact Date)   SpO2 94%   BMI 44.43 kg/m   Vitals:   05/11/22 1534  BP: 128/80  Pulse: 92  Height: 5\' 5"  (1.651 m)  Weight: 267 lb (121.1 kg)  SpO2: 94%  BMI (Calculated): 44.43    Physical Exam Vitals and nursing note reviewed.  Constitutional:      Appearance: Normal appearance. She is normal weight.  HENT:     Head: Normocephalic.  Eyes:     Pupils: Pupils are equal, round, and reactive to light.  Cardiovascular:     Rate and Rhythm: Normal rate.  Pulmonary:     Effort: Pulmonary effort is normal.  Neurological:     Mental Status: She is alert.  Psychiatric:         Mood and Affect: Mood normal.        Behavior: Behavior normal.        Thought Content: Thought content normal.        Judgment: Judgment normal.      Results for orders placed or performed in visit on 05/11/22  NuSwab Vaginitis Plus (VG+)  Result Value Ref Range   Atopobium vaginae High - 2 (A) Score   BVAB 2 High - 2 (A) Score   Megasphaera 1 High - 2 (A) Score   Candida albicans, NAA Positive (A) Negative   Candida glabrata, NAA Negative Negative   Trich vag by NAA Negative Negative   Chlamydia trachomatis, NAA Positive (A) Negative   Neisseria gonorrhoeae, NAA Negative Negative    Recent Results (from the past 2160 hour(s))  NuSwab Vaginitis Plus (VG+)     Status: Abnormal   Collection Time: 05/11/22  4:04 PM  Result Value Ref Range   Atopobium vaginae High - 2 (A) Score   BVAB 2 High - 2 (A) Score   Megasphaera 1 High - 2 (A) Score    Comment: Calculate total score by adding the 3 individual bacterial vaginosis (BV) marker scores together.  Total score is interpreted as follows: Total score 0-1: Indicates the absence of BV. Total score   2: Indeterminate for BV. Additional clinical                  data should be evaluated to establish a                  diagnosis. Total score 3-6: Indicates the presence of BV. This test was developed and its performance characteristics determined by [facility].  It has not been cleared or approved by the Food and Drug Administration.    Candida albicans, NAA Positive (A) Negative   Candida glabrata, NAA Negative Negative   Trich vag by NAA Negative Negative   Chlamydia trachomatis, NAA Positive (A) Negative   Neisseria gonorrhoeae, NAA Negative Negative       Assessment & Plan:   Problem List Items Addressed This Visit   None Visit Diagnoses     Acute vaginitis    -  Primary   Relevant Orders   NuSwab Vaginitis Plus (VG+) (Completed)       Return if symptoms worsen or fail to improve.   Total time  spent: 30  minutes  Miki Kins, FNP  05/11/2022

## 2022-05-19 ENCOUNTER — Telehealth: Payer: Self-pay

## 2022-05-19 MED ORDER — METRONIDAZOLE 500 MG PO TABS: 500.0000 mg | ORAL_TABLET | Freq: Two times a day (BID) | ORAL | 0 refills | Status: AC

## 2022-05-19 MED ORDER — DOXYCYCLINE HYCLATE 100 MG PO CAPS: 100.0000 mg | ORAL_CAPSULE | Freq: Two times a day (BID) | ORAL | 0 refills | Status: AC

## 2022-05-19 NOTE — Telephone Encounter (Signed)
[facility], including the treatment.   Yes she needs all of it His dosing would have been different, that's why he should have gone there.

## 2022-05-19 NOTE — Telephone Encounter (Signed)
Pt called regarding rx abx, said she has been splitting them with her boyfriend so he can get treatment as well. She asked if she needs the full rx to completely get rid of this? if so can you re-send rx abx for her to finish the treatment. please advise

## 2022-05-19 NOTE — Addendum Note (Signed)
Addended by: Grayling Congress on: 05/19/2022 09:14 AM   Modules accepted: Orders

## 2022-05-19 NOTE — Telephone Encounter (Signed)
Pt informed

## 2022-06-02 ENCOUNTER — Telehealth: Payer: Self-pay | Admitting: Family

## 2022-06-02 NOTE — Telephone Encounter (Signed)
Patient needs new work excuse for days 4/9-4/10 instead of 4/10-4/11 and for days 2/20 and 2/22.   Email to [email or web address]. or send in MyChart.

## 2022-06-09 ENCOUNTER — Telehealth: Payer: Self-pay | Admitting: Family Medicine

## 2022-06-09 MED ORDER — MOUNJARO 10 MG/0.5ML ~~LOC~~ SOAJ: 10.0000 mg | SUBCUTANEOUS | 3 refills | Status: DC

## 2022-06-09 NOTE — Telephone Encounter (Signed)
Patient called needing next dose up of Mounjaro sent in to [facility], she believes it is the 10.

## 2022-06-09 NOTE — Telephone Encounter (Signed)
Patient needs these notes soon please

## 2022-08-05 ENCOUNTER — Telehealth: Payer: Self-pay | Admitting: Family

## 2022-08-05 NOTE — Telephone Encounter (Signed)
Patient called in needing next dose up of [facility] sent to pharmacy.   [facility] - [facility] and [address]

## 2022-08-06 ENCOUNTER — Other Ambulatory Visit: Payer: Self-pay

## 2022-08-07 ENCOUNTER — Other Ambulatory Visit: Payer: Self-pay

## 2022-08-07 MED ORDER — MOUNJARO 12.5 MG/0.5ML ~~LOC~~ SOAJ: 12.5000 mg | SUBCUTANEOUS | 1 refills | Status: DC

## 2022-10-04 ENCOUNTER — Other Ambulatory Visit: Payer: Self-pay | Admitting: Family

## 2022-10-12 ENCOUNTER — Other Ambulatory Visit: Payer: Self-pay | Admitting: Family

## 2022-10-14 ENCOUNTER — Other Ambulatory Visit: Payer: Self-pay

## 2022-10-14 ENCOUNTER — Other Ambulatory Visit: Payer: Self-pay | Admitting: Family

## 2022-10-14 MED ORDER — MOUNJARO 12.5 MG/0.5ML ~~LOC~~ SOAJ: 12.5000 mg | SUBCUTANEOUS | 1 refills | Status: DC

## 2022-12-04 ENCOUNTER — Telehealth: Payer: Self-pay

## 2022-12-04 NOTE — Telephone Encounter (Signed)
Patient called saying her sciatica was bad and also she is having muscle spasms. Please advise.

## 2023-01-07 ENCOUNTER — Other Ambulatory Visit: Payer: Self-pay | Admitting: Family

## 2023-01-11 ENCOUNTER — Telehealth: Payer: Self-pay | Admitting: Family

## 2023-01-11 NOTE — Telephone Encounter (Signed)
Patient left VM wanting to switch from Mounjaro 12.5 to Zepbound 10. She needs an appointment for any med changes because we haven't seen her since April.

## 2023-01-20 ENCOUNTER — Encounter: Payer: Self-pay | Admitting: Family

## 2023-01-20 ENCOUNTER — Ambulatory Visit: Payer: Medicaid Other | Admitting: Family

## 2023-01-20 VITALS — BP 138/82 | Ht 65.0 in | Wt 234.0 lb

## 2023-01-20 DIAGNOSIS — Z6841 Body Mass Index (BMI) 40.0 and over, adult: Secondary | ICD-10-CM

## 2023-01-20 DIAGNOSIS — M5431 Sciatica, right side: Secondary | ICD-10-CM

## 2023-01-20 DIAGNOSIS — M5432 Sciatica, left side: Secondary | ICD-10-CM

## 2023-01-20 DIAGNOSIS — E66813 Obesity, class 3: Secondary | ICD-10-CM

## 2023-01-20 MED ORDER — ZEPBOUND 12.5 MG/0.5ML ~~LOC~~ SOAJ: 12.5000 mg | SUBCUTANEOUS | 2 refills | Status: DC

## 2023-01-20 MED ORDER — IBUPROFEN 800 MG PO TABS: 800.0000 mg | ORAL_TABLET | Freq: Three times a day (TID) | ORAL | 1 refills | Status: AC | PRN

## 2023-01-20 NOTE — Progress Notes (Signed)
 Established Patient Office Visit  Subjective:  Patient ID: Donna Vaughan, female    DOB: [birth year]  Age: 43 y.o. MRN: [id number]  Chief Complaint  Patient presents with   Follow-up    Medication changes     Sciatica - has been continuing to bother her.  She has been having trouble with it that cause her to have issues sitting for work.  She does have FMLA paperwork.   Also asks if we can switch her meds     No other concerns at this time.   Past Medical History:  Diagnosis Date   Anemia    Anxiety    Depression    Encounter for sterilization 12/31/2015   Fibroid uterus 08/04/2014   Multiple fibroids, largest 5.4x4.3x3.7 (ant) @ 21wks   Fibroids    Headache    Hypertension    Morbid obesity (HCC)    Pregnant    Prior pregnancy with fetal demise 11/12/2015   10/30/15: 23wks, tight nuchal, IUGR, CHTN   Vitamin D insufficiency     Past Surgical History:  Procedure Laterality Date   ABDOMINAL HYSTERECTOMY     partial   ADENOIDECTOMY     CHOLECYSTECTOMY     DILITATION & CURRETTAGE/HYSTROSCOPY WITH NOVASURE ABLATION N/A 12/31/2015   Procedure: DILATATION & CURETTAGE/HYSTEROSCOPY WITH ENDOMETRIAL  ABLATION;  Surgeon: Norleen LULLA Server, MD;  Location: [facility];  Service: Gynecology;  Laterality: N/A;   FOOT SURGERY Left    removal of foreign body   LAPAROSCOPIC BILATERAL SALPINGECTOMY Bilateral 12/31/2015   Procedure: LAPAROSCOPIC BILATERAL SALPINGECTOMY;  Surgeon: Norleen LULLA Server, MD;  Location: [facility];  Service: Gynecology;  Laterality: Bilateral;   SUPRACERVICAL ABDOMINAL HYSTERECTOMY N/A 05/26/2016   Procedure: ABDOMINAL SUPRACERVICAL HYSTERECTOMY;  Surgeon: Server Norleen LULLA, MD;  Location: [facility];  Service: Gynecology;  Laterality: N/A;   TONSILLECTOMY      Social History   Socioeconomic History   Marital status: Single    Spouse name: Reyes   Number of children: 3   Years of education: 14   Highest education level: Not on file  Occupational History    Occupation: tech support    Comment: [employer]  Tobacco Use   Smoking status: Never   Smokeless tobacco: Never  Vaping Use   Vaping status: Never Used  Substance and Sexual Activity   Alcohol use: No   Drug use: No   Sexual activity: Yes    Partners: Male    Birth control/protection: Surgical  Other Topics Concern   Not on file  Social History Narrative   Some college   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   Lives at 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   Social Drivers of Corporate Investment Banker Strain: Not on file  Food Insecurity: Not on file  Transportation Needs: Not on file  Physical Activity: Not on file  Stress: Not on file  Social Connections: Not on file  Intimate Partner Violence: Not on file    Family History  Problem Relation Age of Onset   Hypertension Brother    Asthma Son    Diabetes Maternal Uncle    Stroke Maternal Grandfather    Hypertension Paternal Grandmother    Heart disease Paternal Grandmother        CHF   Cancer Paternal Grandmother        ovarian   COPD Son        bronchitis    Allergies  Allergen Reactions   Ramipril  Anaphylaxis  Review of Systems  All other systems reviewed and are negative.      Objective:   BP 138/82   Ht 5' 5 (1.651 m)   Wt 234 lb (106.1 kg)   LMP 12/17/2015 (Exact Date)   BMI 38.94 kg/m   Vitals:   01/20/23 1335  BP: 138/82  Height: 5' 5 (1.651 m)  Weight: 234 lb (106.1 kg)  BMI (Calculated): 38.94    Physical Exam Vitals and nursing note reviewed.  Constitutional:      Appearance: Normal appearance. She is normal weight.  HENT:     Head: Normocephalic.  Eyes:     Extraocular Movements: Extraocular movements intact.     Conjunctiva/sclera: Conjunctivae normal.     Pupils: Pupils are equal, round, and reactive to light.  Cardiovascular:     Rate and Rhythm: Normal rate.  Pulmonary:     Effort: Pulmonary effort is normal.  Musculoskeletal:     Cervical back: Normal range of motion.   Neurological:     General: No focal deficit present.     Mental Status: She is alert and oriented to person, place, and time. Mental status is at baseline.  Psychiatric:        Mood and Affect: Mood normal.        Behavior: Behavior normal.        Thought Content: Thought content normal.        Judgment: Judgment normal.      No results found for any visits on 01/20/23.  No results found for this or any previous visit (from the past 2160 hours).     Assessment & Plan:   Problem List Items Addressed This Visit       Other   OBESITY - Primary (Chronic)   Other Visit Diagnoses       Bilateral sciatica           Return in about 6 months (around 07/20/2023) for F/U.   Total time spent: 20 minutes  ALAN CHRISTELLA ARRANT, FNP  01/20/2023   This document may have been prepared by [facility] Voice Recognition software and as such may include unintentional dictation errors.

## 2023-01-26 ENCOUNTER — Telehealth: Payer: Self-pay | Admitting: Family

## 2023-01-26 NOTE — Telephone Encounter (Signed)
 Patient left VM to check status of her Zepbound PA.

## 2023-02-02 NOTE — Telephone Encounter (Signed)
Patient left VM wanting update on her Zepbound PA. It was denied.Marland Kitchen

## 2023-02-03 MED ORDER — WEGOVY 1 MG/0.5ML ~~LOC~~ SOAJ: 1.0000 mg | SUBCUTANEOUS | 0 refills | Status: DC

## 2023-02-05 ENCOUNTER — Other Ambulatory Visit: Payer: Self-pay | Admitting: Family

## 2023-02-05 MED ORDER — MOUNJARO 12.5 MG/0.5ML ~~LOC~~ SOAJ: 12.5000 mg | SUBCUTANEOUS | 2 refills | Status: DC

## 2023-03-01 ENCOUNTER — Telehealth: Payer: Self-pay | Admitting: Family

## 2023-03-01 NOTE — Telephone Encounter (Signed)
 Patient left VM requesting Mounjaro 15 mg be sent in to [facility] in [place].

## 2023-03-02 ENCOUNTER — Other Ambulatory Visit: Payer: Self-pay

## 2023-03-02 MED ORDER — MOUNJARO 15 MG/0.5ML ~~LOC~~ SOAJ: 15.0000 mg | SUBCUTANEOUS | 1 refills | Status: DC

## 2023-03-02 NOTE — Telephone Encounter (Signed)
 15mg  has been sent

## 2023-03-08 ENCOUNTER — Other Ambulatory Visit: Payer: Self-pay

## 2023-03-08 MED ORDER — MOUNJARO 15 MG/0.5ML ~~LOC~~ SOAJ: 15.0000 mg | SUBCUTANEOUS | 1 refills | Status: DC

## 2023-05-01 ENCOUNTER — Other Ambulatory Visit: Payer: Self-pay | Admitting: Family

## 2023-05-10 ENCOUNTER — Ambulatory Visit: Payer: Self-pay

## 2023-05-10 ENCOUNTER — Telehealth: Payer: Self-pay | Admitting: Family

## 2023-05-10 NOTE — Telephone Encounter (Signed)
 Patient left VM that she found a lump in her breast this weekend and would like a STAT referral to Cristine Done [facility] radiology. Please place appropriate referral/order.

## 2023-05-10 NOTE — Telephone Encounter (Signed)
 Copied from CRM [id number]. Topic: Clinical - Red Word Triage >> May 10, 2023  1:26 PM DeAngela L wrote: Red Word that prompted transfer to Nurse Triage: Patient found a lump in her breast Saturday tender feeling if there is any pressure it's tender   Chief Complaint: Breast Lump  Symptoms: Lump   Frequency: Since Saturday  Pertinent Negatives: Patient denies fever, redness, discoloration, nipple inversion, drainage, or any other symptoms.   Disposition: [] ED /[] Urgent Care (no appt availability in office) / [x] Appointment(In office/virtual)/ []  [facility]/ [] Home Care/ [] Refused Recommended Disposition /[] [facility]/ []  Follow-up with PCP Additional Notes: TA is being triaged for a lump found in her breast on Saturday. The patient states she has not had any other symptom besides the lump, in the past she had a swollen lymph node that caused a lump in her breast.   Patient has a history of sciatica and has noticed more flare ups this month.   In office appointment made for tomorrow with another provider.   Reason for Disposition  Breast lump  Answer Assessment - Initial Assessment Questions 1. SYMPTOM: "What's the main symptom you're concerned about?"  (e.g., lump, pain, rash, nipple discharge)     Lump 2. LOCATION: "Where is the Lump located?"      Right breast, Side closer to the armpit  3. ONSET: "When did the lump  start?"      Found it Saturday  4. PRIOR HISTORY: "Do you have any history of prior problems with your breasts?" (e.g., lumps, cancer, fibrocystic breast disease)     2009, Pebble size lump, lymph nodes  5. CAUSE: "What do you think is causing this symptom?"     Unsure  6. OTHER SYMPTOMS: "Do you have any other symptoms?" (e.g., fever, breast pain, redness or rash, nipple discharge)     Tenderness after consistent palpation.   7. PREGNANCY-BREASTFEEDING: "Is there any chance you are pregnant?" "When was your last menstrual period?" "Are  you breastfeeding?"     No, Hysterectomy (Partial)  Protocols used: Breast Symptoms-A-AH

## 2023-05-11 ENCOUNTER — Ambulatory Visit: Payer: Self-pay | Admitting: Internal Medicine

## 2023-05-11 ENCOUNTER — Encounter: Payer: Self-pay | Admitting: Internal Medicine

## 2023-05-11 VITALS — BP 137/98 | HR 66 | Ht 65.0 in | Wt 217.6 lb

## 2023-05-11 DIAGNOSIS — N6311 Unspecified lump in the right breast, upper outer quadrant: Secondary | ICD-10-CM | POA: Diagnosis not present

## 2023-05-11 DIAGNOSIS — N6310 Unspecified lump in the right breast, unspecified quadrant: Secondary | ICD-10-CM | POA: Insufficient documentation

## 2023-05-11 NOTE — Patient Instructions (Signed)
 It was a pleasure to see you today.  Thank you for giving us  the opportunity to be involved in your care.  Below is a brief recap of your visit and next steps.    Summary Diagnostic mammogram and ultrasound of the left breast ordered today for further evaluation of left breast mass.

## 2023-05-11 NOTE — Assessment & Plan Note (Addendum)
 Presenting today for an acute visit after discovering a mass in the lateral aspect of the right breast 4 days ago.  On exam, there is a mildly tender, palpable mass at the 9 o'clock position of the right breast.  No overlying skin changes or nipple discharge appreciated.  Mammogram last completed in 2011.  She is unaware of any family medical history of breast cancer. -Diagnostic mammogram and US  right breast ordered today.  Further management pending results.  She will return for follow-up as needed.

## 2023-05-11 NOTE — Progress Notes (Signed)
   Acute Office Visit  Subjective:     Patient ID: Donna Vaughan, female    DOB: [birth year], 43 y.o.   MRN: [id number]  Chief Complaint  Patient presents with   Breast Mass    Lump on right breast , can be painful, found Saturday    Donna Vaughan presents today for an acute visit after noticing a lump in her right breast last weekend.  She noted a painful lump along the lateral aspect of the right breast on Saturday.  She says it is painful at times.  She has not appreciated any overlying erythema.  Denies discharge from the right nipple.  Denies any recent illness.  Her last mammogram was completed in 2011 and was negative for any acute findings.  She is unaware of any family medical history of breast cancer.  Review of Systems  Musculoskeletal:        Tender mass of right breast  All other systems reviewed and are negative.     Objective:    BP (!) 137/98   Pulse 66   Ht 5\' 5"  (1.651 m)   Wt 217 lb 9.6 oz (98.7 kg)   LMP 12/17/2015 (Exact Date)   SpO2 98%   BMI 36.21 kg/m   Physical Exam Vitals reviewed.  Constitutional:      Appearance: Normal appearance.  Skin:    General: Skin is warm and dry.     Comments: No obvious asymmetry or deformity noted at the right breast.  No overlying skin changes appreciated.  There is a palpable, mobile mass at the 9 o'clock position of the right breast that is mildly tender to deep palpation.  No right axillary lymphadenopathy appreciated.  No palpable masses noted in the right breast.  No left axillary lymphadenopathy appreciated.  Neurological:     Mental Status: She is alert.       Assessment & Plan:   Problem List Items Addressed This Visit       Mass of right breast - Primary   Presenting today for an acute visit after discovering a mass in the lateral aspect of the right breast 4 days ago.  On exam, there is a mildly tender, palpable mass at the 9 o'clock position of the right breast.  No overlying skin changes or nipple  discharge appreciated.  Mammogram last completed in 2011.  She is unaware of any family medical history of breast cancer. -Diagnostic mammogram and US  right breast ordered today.  Further management pending results.  She will return for follow-up as needed.      Return if symptoms worsen or fail to improve.  Tobi Fortes, MD

## 2023-05-11 NOTE — Telephone Encounter (Signed)
 Patient seen dr Kermit Ped

## 2023-05-12 ENCOUNTER — Ambulatory Visit
Admission: RE | Admit: 2023-05-12 | Discharge: 2023-05-12 | Disposition: A | Discharge status: Home / Self Care | Source: Ambulatory Visit | Attending: Internal Medicine | Admitting: Internal Medicine

## 2023-05-12 DIAGNOSIS — N6311 Unspecified lump in the right breast, upper outer quadrant: Secondary | ICD-10-CM

## 2023-06-29 ENCOUNTER — Ambulatory Visit: Admitting: Family

## 2023-06-29 ENCOUNTER — Encounter: Payer: Self-pay | Admitting: Family

## 2023-06-29 ENCOUNTER — Other Ambulatory Visit: Payer: Self-pay | Admitting: Family

## 2023-06-29 VITALS — BP 118/68 | Ht 65.0 in | Wt 215.6 lb

## 2023-06-29 DIAGNOSIS — F322 Major depressive disorder, single episode, severe without psychotic features: Secondary | ICD-10-CM

## 2023-06-29 DIAGNOSIS — F4322 Adjustment disorder with anxiety: Secondary | ICD-10-CM | POA: Diagnosis not present

## 2023-06-29 DIAGNOSIS — Z0130 Encounter for examination of blood pressure without abnormal findings: Secondary | ICD-10-CM

## 2023-06-29 MED ORDER — HYDROXYZINE HCL 10 MG PO TABS: 10.0000 mg | ORAL_TABLET | Freq: Three times a day (TID) | ORAL | 0 refills | Status: AC | PRN

## 2023-06-29 MED ORDER — BACLOFEN 10 MG PO TABS: 10.0000 mg | ORAL_TABLET | Freq: Two times a day (BID) | ORAL | 11 refills | Status: AC

## 2023-06-29 MED ORDER — ESCITALOPRAM OXALATE 5 MG PO TABS: 5.0000 mg | ORAL_TABLET | Freq: Every day | ORAL | 1 refills | Status: AC

## 2023-06-29 NOTE — Progress Notes (Unsigned)
 Established Patient Office Visit  Subjective:  Patient ID: Donna Vaughan, female    DOB: 03/23/1980  Age: 43 y.o. MRN: [id number]  Chief Complaint  Patient presents with   Follow-up    Discuss short term disability for sciatica    Patient is here today for follow up.  She has been feeling poorly since last appointment.   She does have additional concerns to discuss today.  She has been having a flare up of her sciatica, but her bigger concern is that she has been having severe anxiety and depression, including panic attacks and severe difficulty with wanting to do anything, enjoying any actiivties, or maintaining her own personal ADLs.   Labs are not due today.  She needs refills.   I have reviewed her active problem list, medication list, allergies, notes from last encounter, lab results for her appointment today.      No other concerns at this time.   Past Medical History:  Diagnosis Date   Anemia    Anxiety    Depression    Encounter for sterilization 12/31/2015   Fibroid uterus 08/04/2014   Multiple fibroids, largest 5.4x4.3x3.7 (ant) @ 21wks   Fibroids    Headache    Hypertension    Morbid obesity (HCC)    Pregnant    Prior pregnancy with fetal demise 11/12/2015   10/30/15: 23wks, tight nuchal, IUGR, CHTN   Vitamin D insufficiency     Past Surgical History:  Procedure Laterality Date   ABDOMINAL HYSTERECTOMY     partial   ADENOIDECTOMY     CHOLECYSTECTOMY     DILITATION & CURRETTAGE/HYSTROSCOPY WITH NOVASURE ABLATION N/A 12/31/2015   Procedure: DILATATION & CURETTAGE/HYSTEROSCOPY WITH ENDOMETRIAL  ABLATION;  Surgeon: Norleen LULLA Server, MD;  Location: [facility];  Service: Gynecology;  Laterality: N/A;   FOOT SURGERY Left    removal of foreign body   LAPAROSCOPIC BILATERAL SALPINGECTOMY Bilateral 12/31/2015   Procedure: LAPAROSCOPIC BILATERAL SALPINGECTOMY;  Surgeon: Norleen LULLA Server, MD;  Location: [facility];  Service: Gynecology;  Laterality: Bilateral;    SUPRACERVICAL ABDOMINAL HYSTERECTOMY N/A 05/26/2016   Procedure: ABDOMINAL SUPRACERVICAL HYSTERECTOMY;  Surgeon: Server Norleen LULLA, MD;  Location: [facility];  Service: Gynecology;  Laterality: N/A;   TONSILLECTOMY      Social History   Socioeconomic History   Marital status: Single    Spouse name: Reyes   Number of children: 3   Years of education: 14   Highest education level: Not on file  Occupational History   Occupation: tech support    Comment: [employer]  Tobacco Use   Smoking status: Never   Smokeless tobacco: Never  Vaping Use   Vaping status: Never Used  Substance and Sexual Activity   Alcohol use: No   Drug use: No   Sexual activity: Yes    Partners: Male    Birth control/protection: Surgical  Other Topics Concern   Not on file  Social History Narrative   Some college   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   Lives at 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   Social Drivers of Corporate investment banker Strain: Not on file  Food Insecurity: Not on file  Transportation Needs: Not on file  Physical Activity: Not on file  Stress: Not on file  Social Connections: Not on file  Intimate Partner Violence: Not on file    Family History  Problem Relation Age of Onset   Hypertension Brother    Asthma Son  Diabetes Maternal Uncle    Stroke Maternal Grandfather    Hypertension Paternal Grandmother    Heart disease Paternal Grandmother        CHF   Cancer Paternal Grandmother        ovarian   COPD Son        bronchitis    Allergies  Allergen Reactions   Ramipril  Anaphylaxis    Review of Systems  Musculoskeletal:  Positive for back pain.  Psychiatric/Behavioral:  Positive for depression. The patient is nervous/anxious.   All other systems reviewed and are negative.      Objective:   BP 118/68   Ht 5' 5 (1.651 m)   Wt 215 lb 9.6 oz (97.8 kg)   LMP 12/17/2015 (Exact Date)   BMI 35.88 kg/m   Vitals:   06/29/23 1324  BP: 118/68  Height: 5' 5 (1.651 m)  Weight:  215 lb 9.6 oz (97.8 kg)  BMI (Calculated): 35.88    Physical Exam Vitals and nursing note reviewed.  Constitutional:      Appearance: Normal appearance. She is normal weight.  HENT:     Head: Normocephalic.  Eyes:     Extraocular Movements: Extraocular movements intact.     Conjunctiva/sclera: Conjunctivae normal.     Pupils: Pupils are equal, round, and reactive to light.  Cardiovascular:     Rate and Rhythm: Normal rate.  Pulmonary:     Effort: Pulmonary effort is normal.  Neurological:     General: No focal deficit present.     Mental Status: She is alert and oriented to person, place, and time. Mental status is at baseline.  Psychiatric:        Mood and Affect: Mood is anxious and depressed. Affect is labile and tearful.        Speech: Speech normal.        Behavior: Behavior normal.        Thought Content: Thought content normal.        Cognition and Memory: Cognition and memory normal.        Judgment: Judgment normal.      No results found for any visits on 06/29/23.  No results found for this or any previous visit (from the past 2160 hours).     Assessment & Plan Current severe episode of major depressive disorder without psychotic features without prior episode (HCC) Adjustment disorder with anxious mood I believe that patient would benefit from some time away from work to help her with her mental health struggles.  Starting her on medications today, will reassess at follow up.      No follow-ups on file.   Total time spent: 20 minutes  ALAN CHRISTELLA ARRANT, FNP  06/29/2023   This document may have been prepared by [facility] Voice Recognition software and as such may include unintentional dictation errors.

## 2023-07-20 ENCOUNTER — Ambulatory Visit: Payer: Medicaid Other | Admitting: Family

## 2023-07-21 ENCOUNTER — Ambulatory Visit: Admitting: Family

## 2023-07-28 ENCOUNTER — Ambulatory Visit: Admitting: Family

## 2023-07-30 ENCOUNTER — Encounter: Payer: Self-pay | Admitting: Family

## 2023-07-30 ENCOUNTER — Ambulatory Visit: Admitting: Family

## 2023-07-30 VITALS — BP 148/100 | Ht 65.0 in | Wt 210.6 lb

## 2023-07-30 DIAGNOSIS — F4322 Adjustment disorder with anxiety: Secondary | ICD-10-CM | POA: Diagnosis not present

## 2023-07-30 DIAGNOSIS — E66813 Obesity, class 3: Secondary | ICD-10-CM

## 2023-07-30 DIAGNOSIS — F322 Major depressive disorder, single episode, severe without psychotic features: Secondary | ICD-10-CM

## 2023-07-30 DIAGNOSIS — Z6841 Body Mass Index (BMI) 40.0 and over, adult: Secondary | ICD-10-CM

## 2023-07-30 NOTE — Progress Notes (Signed)
 Established Patient Office Visit  Subjective:  Patient ID: Donna Vaughan, female    DOB: 1980/07/11  Age: 43 y.o. MRN: [id number]  Chief Complaint  Patient presents with   Follow-up    Patient is here today for her 1 month follow up.  She has been feeling poorly since last appointment.   She does have additional concerns to discuss today.  She is continuing to have depression and severe fatigue from dealing with her mental health issues.  She says that the Lexapro  has been helping, but she still is having episodes of tearfulness and emotional lability.  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  She is not emotionally able to work due to the outbursts and sudden tearfulness that she is experiencing.   Labs are not due today.  She needs refills.   I have reviewed her active problem list, medication list, allergies, notes from last encounter, lab results for her appointment today.      No other concerns at this time.   Past Medical History:  Diagnosis Date   Anemia    Anxiety    Depression    Encounter for sterilization 12/31/2015   Fibroid uterus 08/04/2014   Multiple fibroids, largest 5.4x4.3x3.7 (ant) @ 21wks   Fibroids    Headache    Hypertension    Morbid obesity (HCC)    Pregnant    Prior pregnancy with fetal demise 11/12/2015   10/30/15: 23wks, tight nuchal, IUGR, CHTN   Vitamin D insufficiency     Past Surgical History:  Procedure Laterality Date   ABDOMINAL HYSTERECTOMY     partial   ADENOIDECTOMY     CHOLECYSTECTOMY     DILITATION & CURRETTAGE/HYSTROSCOPY WITH NOVASURE ABLATION N/A 12/31/2015   Procedure: DILATATION & CURETTAGE/HYSTEROSCOPY WITH ENDOMETRIAL  ABLATION;  Surgeon: Norleen LULLA Server, MD;  Location: [facility];  Service: Gynecology;  Laterality: N/A;   FOOT SURGERY Left    removal of foreign body   LAPAROSCOPIC BILATERAL SALPINGECTOMY Bilateral 12/31/2015   Procedure: LAPAROSCOPIC BILATERAL SALPINGECTOMY;   Surgeon: Norleen LULLA Server, MD;  Location: [facility];  Service: Gynecology;  Laterality: Bilateral;   SUPRACERVICAL ABDOMINAL HYSTERECTOMY N/A 05/26/2016   Procedure: ABDOMINAL SUPRACERVICAL HYSTERECTOMY;  Surgeon: Server Norleen LULLA, MD;  Location: [facility];  Service: Gynecology;  Laterality: N/A;   TONSILLECTOMY      Social History   Socioeconomic History   Marital status: Single    Spouse name: Reyes   Number of children: 3   Years of education: 14   Highest education level: Not on file  Occupational History   Occupation: tech support    Comment: [employer]  Tobacco Use   Smoking status: Never   Smokeless tobacco: Never  Vaping Use   Vaping status: Never Used  Substance and Sexual Activity   Alcohol use: No   Drug use: No   Sexual activity: Yes    Partners: Male    Birth control/protection: Surgical  Other Topics Concern   Not on file  Social History Narrative   Some college   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   Lives at 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   Social Drivers of Corporate investment banker Strain: Not on file  Food Insecurity: Not on file  Transportation Needs: Not on file  Physical Activity: Not on file  Stress: Not on file  Social Connections: Not on file  Intimate Partner Violence: Not on file    Family History  Problem Relation Age of Onset   Hypertension Brother    Asthma Son    Diabetes Maternal Uncle    Stroke Maternal Grandfather    Hypertension Paternal Grandmother    Heart disease Paternal Grandmother        CHF   Cancer Paternal Grandmother        ovarian   COPD Son        bronchitis    Allergies  Allergen Reactions   Ramipril  Anaphylaxis    Review of Systems  Psychiatric/Behavioral:  Positive for depression. The patient is nervous/anxious.   All other systems reviewed and are negative.      Objective:   BP (!) 148/100   Ht 5' 5 (1.651 m)   Wt 210 lb 9.6 oz (95.5 kg)   LMP 12/17/2015 (Exact Date)   BMI 35.05 kg/m   Vitals:    07/30/23 1324  BP: (!) 148/100  Height: 5' 5 (1.651 m)  Weight: 210 lb 9.6 oz (95.5 kg)  BMI (Calculated): 35.05    Physical Exam Vitals and nursing note reviewed.  Constitutional:      Appearance: Normal appearance. She is normal weight.  HENT:     Head: Normocephalic.  Eyes:     Extraocular Movements: Extraocular movements intact.     Conjunctiva/sclera: Conjunctivae normal.     Pupils: Pupils are equal, round, and reactive to light.  Cardiovascular:     Rate and Rhythm: Normal rate.  Pulmonary:     Effort: Pulmonary effort is normal.  Neurological:     General: No focal deficit present.     Mental Status: She is alert and oriented to person, place, and time. Mental status is at baseline.  Psychiatric:        Attention and Perception: Attention normal.        Mood and Affect: Mood is anxious and depressed. Affect is labile and tearful.        Speech: Speech normal.        Behavior: Behavior normal. Behavior is cooperative.        Thought Content: Thought content normal.        Cognition and Memory: Cognition and memory normal.      No results found for any visits on 07/30/23.  No results found for this or any previous visit (from the past 2160 hours).     Assessment & Plan Class 3 severe obesity due to excess calories without serious comorbidity with body mass index (BMI) of 45.0 to 49.9 in adult Continue current meds.  Will adjust as needed based on results.  The patient is asked to make an attempt to improve diet and exercise patterns to aid in medical management of this problem. Addressed importance of increasing and maintaining water intake.   Adjustment disorder with anxious mood Current severe episode of major depressive disorder without psychotic features without prior episode (HCC) Mood has improved, but patient is still having symptoms. Increasing dose.  Will reassess at follow up.     Return in about 2 months (around 09/30/2023).   Total time spent:  20 minutes  ALAN CHRISTELLA ARRANT, FNP  07/30/2023   This document may have been prepared by [facility] Voice Recognition software and as such may include unintentional dictation errors.

## 2023-08-05 ENCOUNTER — Encounter: Payer: Self-pay | Admitting: Family

## 2023-08-05 NOTE — Assessment & Plan Note (Signed)
 Continue current meds.  Will adjust as needed based on results.  The patient is asked to make an attempt to improve diet and exercise patterns to aid in medical management of this problem. Addressed importance of increasing and maintaining water intake.

## 2023-08-21 ENCOUNTER — Other Ambulatory Visit: Payer: Self-pay | Admitting: Family

## 2023-09-17 ENCOUNTER — Ambulatory Visit: Admitting: Family

## 2023-10-20 ENCOUNTER — Other Ambulatory Visit: Payer: Self-pay | Admitting: Family

## 2023-12-29 ENCOUNTER — Other Ambulatory Visit: Payer: Self-pay | Admitting: Family
